# Patient Record
Sex: Male | Born: 1970 | Race: White | Hispanic: No | Smoking: Never smoker
Health system: Southern US, Community
[De-identification: ages and names within clinical notes are randomized; demographics above are authoritative.]

## PROBLEM LIST (undated history)

## (undated) DIAGNOSIS — Z87442 Personal history of urinary calculi: Secondary | ICD-10-CM

## (undated) DIAGNOSIS — T7840XA Allergy, unspecified, initial encounter: Secondary | ICD-10-CM

## (undated) DIAGNOSIS — S46011A Strain of muscle(s) and tendon(s) of the rotator cuff of right shoulder, initial encounter: Secondary | ICD-10-CM

## (undated) DIAGNOSIS — N200 Calculus of kidney: Secondary | ICD-10-CM

## (undated) DIAGNOSIS — K219 Gastro-esophageal reflux disease without esophagitis: Secondary | ICD-10-CM

## (undated) HISTORY — PX: LIPOMA EXCISION: SHX5283

## (undated) HISTORY — DX: Allergy, unspecified, initial encounter: T78.40XA

---

## 1998-09-25 ENCOUNTER — Encounter: Payer: Self-pay | Admitting: Emergency Medicine

## 1998-09-25 ENCOUNTER — Emergency Department (HOSPITAL_COMMUNITY): Admission: EM | Admit: 1998-09-25 | Discharge: 1998-09-25 | Payer: Self-pay | Admitting: Emergency Medicine

## 2000-12-14 ENCOUNTER — Emergency Department (HOSPITAL_COMMUNITY): Admission: EM | Admit: 2000-12-14 | Discharge: 2000-12-14 | Payer: Self-pay | Admitting: Emergency Medicine

## 2001-10-07 ENCOUNTER — Emergency Department (HOSPITAL_COMMUNITY): Admission: EM | Admit: 2001-10-07 | Discharge: 2001-10-07 | Payer: Self-pay

## 2001-10-31 ENCOUNTER — Emergency Department (HOSPITAL_COMMUNITY): Admission: EM | Admit: 2001-10-31 | Discharge: 2001-10-31 | Payer: Self-pay | Admitting: *Deleted

## 2001-10-31 ENCOUNTER — Encounter: Payer: Self-pay | Admitting: *Deleted

## 2002-09-02 ENCOUNTER — Encounter: Payer: Self-pay | Admitting: Emergency Medicine

## 2002-09-02 ENCOUNTER — Emergency Department (HOSPITAL_COMMUNITY): Admission: EM | Admit: 2002-09-02 | Discharge: 2002-09-02 | Payer: Self-pay | Admitting: Physical Therapy

## 2003-06-28 ENCOUNTER — Emergency Department (HOSPITAL_COMMUNITY): Admission: EM | Admit: 2003-06-28 | Discharge: 2003-06-28 | Payer: Self-pay | Admitting: Emergency Medicine

## 2004-03-03 ENCOUNTER — Ambulatory Visit: Payer: Self-pay | Admitting: Internal Medicine

## 2004-09-28 ENCOUNTER — Emergency Department: Payer: Self-pay | Admitting: Emergency Medicine

## 2004-10-01 ENCOUNTER — Emergency Department: Payer: Self-pay | Admitting: Emergency Medicine

## 2005-01-26 ENCOUNTER — Ambulatory Visit: Payer: Self-pay | Admitting: Family Medicine

## 2005-02-03 ENCOUNTER — Encounter: Payer: Self-pay | Admitting: Family Medicine

## 2005-02-16 ENCOUNTER — Encounter: Payer: Self-pay | Admitting: Family Medicine

## 2007-06-10 ENCOUNTER — Emergency Department: Payer: Self-pay | Admitting: Internal Medicine

## 2016-08-29 ENCOUNTER — Ambulatory Visit: Payer: Self-pay | Admitting: Nurse Practitioner

## 2017-10-18 DIAGNOSIS — S46011A Strain of muscle(s) and tendon(s) of the rotator cuff of right shoulder, initial encounter: Secondary | ICD-10-CM

## 2017-10-18 HISTORY — DX: Strain of muscle(s) and tendon(s) of the rotator cuff of right shoulder, initial encounter: S46.011A

## 2017-11-02 ENCOUNTER — Encounter (HOSPITAL_BASED_OUTPATIENT_CLINIC_OR_DEPARTMENT_OTHER): Payer: Self-pay | Admitting: Physician Assistant

## 2017-11-02 DIAGNOSIS — N200 Calculus of kidney: Secondary | ICD-10-CM

## 2017-11-02 NOTE — H&P (Signed)
Reginald [name] is an 47 y.o. male.   Chief Complaint: right shoulder rotator cuff tear HPI: Reginald [name] is a 47 year-old seen for follow up evaluation from his right shoulder injury that occurred while jumping on a trampoline on October 18, 2017.  He underwent an MRI that has revealed a complete tear of the subscapularis, as well as supraspinatus tendon.  He continues to have pain and weakness in the arm.  Unable to raise the arm without pain or weakness.     Past Medical History:  Diagnosis Date  . Kidney calculi   . Traumatic complete tear of right rotator cuff 10/18/2017    History reviewed. No pertinent surgical history.  Family History  Problem Relation Age of Onset  . Arthritis Mother   . Hypertension Father    Social History:  reports that he has never smoked. He has never used smokeless tobacco. He reports that he drank alcohol. He reports that he does not use drugs.  Allergies: No Known Allergies  No medications prior to admission.    No results found for this or any previous visit (from the past 48 hour(s)). No results found.  Review of Systems  Eyes: Negative.   Respiratory: Negative.   Cardiovascular: Negative.   Gastrointestinal: Negative.   Genitourinary: Negative.   Musculoskeletal: Positive for joint pain.  Neurological: Negative.   Endo/Heme/Allergies: Negative.   Psychiatric/Behavioral: Negative.     There were no vitals taken for this visit. Physical Exam  Constitutional: He is oriented to person, place, and time. He appears well-developed and well-nourished.  HENT:  Head: Normocephalic and atraumatic.  Mouth/Throat: Oropharynx is clear and moist.  Eyes: Pupils are equal, round, and reactive to light. Conjunctivae are normal.  Neck: Neck supple.  Cardiovascular: Normal rate and regular rhythm.  Respiratory: Effort normal and breath sounds normal.  GI: Soft. Bowel sounds are normal.  Genitourinary:  Genitourinary Comments: Not pertinent to current  symptomatology therefore not examined.  Musculoskeletal:  Examination of his right shoulder reveals active range of motion of 50%, passive to 100% with pain and weakness.  No instability.  Examination of his left shoulder reveals full range of motion without pain, weakness or instability.    Neurological: He is alert and oriented to person, place, and time.  Skin: Skin is warm and dry.  Psychiatric: He has a normal mood and affect. His behavior is normal.     Assessment Right shoulder acute traumatic rotator cuff tear with subscapularis tear.   Plan At this point I have discussed this with him in detail.  Would recommend with these findings that we proceed with right shoulder arthroscopy with rotator cuff repair.  Risks, complications and benefits of the surgery have been described to him in detail and he understands this completely.  We will plan on setting him up for this at some point in the near future.   Cristin Penaflor J Julieth Tugman, PA-C 11/02/2017, 10:03 AM

## 2017-11-08 ENCOUNTER — Other Ambulatory Visit: Payer: Self-pay

## 2017-11-08 ENCOUNTER — Encounter (HOSPITAL_BASED_OUTPATIENT_CLINIC_OR_DEPARTMENT_OTHER): Payer: Self-pay | Admitting: *Deleted

## 2017-11-14 ENCOUNTER — Encounter (HOSPITAL_BASED_OUTPATIENT_CLINIC_OR_DEPARTMENT_OTHER): Payer: Self-pay

## 2017-11-14 ENCOUNTER — Encounter (HOSPITAL_BASED_OUTPATIENT_CLINIC_OR_DEPARTMENT_OTHER)
Admission: RE | Disposition: A | Discharge status: Home / Self Care | Payer: Self-pay | Source: Ambulatory Visit | Attending: Orthopedic Surgery

## 2017-11-14 ENCOUNTER — Ambulatory Visit (HOSPITAL_BASED_OUTPATIENT_CLINIC_OR_DEPARTMENT_OTHER): Payer: Self-pay | Admitting: Certified Registered"

## 2017-11-14 ENCOUNTER — Other Ambulatory Visit: Payer: Self-pay

## 2017-11-14 ENCOUNTER — Ambulatory Visit (HOSPITAL_BASED_OUTPATIENT_CLINIC_OR_DEPARTMENT_OTHER)
Admission: RE | Admit: 2017-11-14 | Discharge: 2017-11-14 | Disposition: A | Discharge status: Home / Self Care | Payer: Self-pay | Source: Ambulatory Visit | Attending: Orthopedic Surgery | Admitting: Orthopedic Surgery

## 2017-11-14 DIAGNOSIS — X58XXXA Exposure to other specified factors, initial encounter: Secondary | ICD-10-CM | POA: Insufficient documentation

## 2017-11-14 DIAGNOSIS — Z791 Long term (current) use of non-steroidal anti-inflammatories (NSAID): Secondary | ICD-10-CM | POA: Insufficient documentation

## 2017-11-14 DIAGNOSIS — S46811A Strain of other muscles, fascia and tendons at shoulder and upper arm level, right arm, initial encounter: Secondary | ICD-10-CM | POA: Insufficient documentation

## 2017-11-14 DIAGNOSIS — N200 Calculus of kidney: Secondary | ICD-10-CM

## 2017-11-14 DIAGNOSIS — S43431A Superior glenoid labrum lesion of right shoulder, initial encounter: Secondary | ICD-10-CM | POA: Insufficient documentation

## 2017-11-14 DIAGNOSIS — M7541 Impingement syndrome of right shoulder: Secondary | ICD-10-CM | POA: Insufficient documentation

## 2017-11-14 DIAGNOSIS — Y9344 Activity, trampolining: Secondary | ICD-10-CM | POA: Insufficient documentation

## 2017-11-14 DIAGNOSIS — S46011A Strain of muscle(s) and tendon(s) of the rotator cuff of right shoulder, initial encounter: Secondary | ICD-10-CM | POA: Diagnosis present

## 2017-11-14 HISTORY — PX: SHOULDER ARTHROSCOPY WITH ROTATOR CUFF REPAIR AND SUBACROMIAL DECOMPRESSION: SHX5686

## 2017-11-14 HISTORY — PX: SHOULDER ARTHROSCOPY WITH BICEPS TENDON REPAIR: SHX5674

## 2017-11-14 HISTORY — DX: Strain of muscle(s) and tendon(s) of the rotator cuff of right shoulder, initial encounter: S46.011A

## 2017-11-14 HISTORY — DX: Calculus of kidney: N20.0

## 2017-11-14 HISTORY — DX: Gastro-esophageal reflux disease without esophagitis: K21.9

## 2017-11-14 SURGERY — SHOULDER ARTHROSCOPY WITH BICEPS TENDON REPAIR
Anesthesia: General | Site: Shoulder | Laterality: Right

## 2017-11-14 MED ORDER — CYCLOBENZAPRINE HCL 5 MG PO TABS: 5.0000 mg | ORAL_TABLET | Freq: Three times a day (TID) | ORAL | 0 refills | Status: DC | PRN

## 2017-11-14 MED ORDER — LIDOCAINE HCL (CARDIAC) PF 100 MG/5ML IV SOSY
PREFILLED_SYRINGE | INTRAVENOUS | Status: AC
  Filled 2017-11-14: qty 5

## 2017-11-14 MED ORDER — SUGAMMADEX SODIUM 200 MG/2ML IV SOLN
INTRAVENOUS | Status: DC | PRN
  Administered 2017-11-14: 100 mg via INTRAVENOUS

## 2017-11-14 MED ORDER — PROPOFOL 10 MG/ML IV BOLUS
INTRAVENOUS | Status: DC | PRN
Start: 2017-11-14 — End: 2017-11-14
  Administered 2017-11-14: 200 mg via INTRAVENOUS

## 2017-11-14 MED ORDER — DEXAMETHASONE SODIUM PHOSPHATE 10 MG/ML IJ SOLN
INTRAMUSCULAR | Status: AC
  Filled 2017-11-14: qty 1

## 2017-11-14 MED ORDER — LIDOCAINE 2% (20 MG/ML) 5 ML SYRINGE
INTRAMUSCULAR | Status: DC | PRN
  Administered 2017-11-14: 80 mg via INTRAVENOUS

## 2017-11-14 MED ORDER — CEFAZOLIN SODIUM-DEXTROSE 2-4 GM/100ML-% IV SOLN
INTRAVENOUS | Status: AC
  Filled 2017-11-14: qty 100

## 2017-11-14 MED ORDER — FENTANYL CITRATE (PF) 100 MCG/2ML IJ SOLN
50.0000 ug | INTRAMUSCULAR | Status: DC | PRN
  Administered 2017-11-14: 100 ug via INTRAVENOUS

## 2017-11-14 MED ORDER — SCOPOLAMINE 1 MG/3DAYS TD PT72: 1.0000 | MEDICATED_PATCH | Freq: Once | TRANSDERMAL | Status: DC | PRN

## 2017-11-14 MED ORDER — ONDANSETRON HCL 4 MG/2ML IJ SOLN
INTRAMUSCULAR | Status: AC
Start: 2017-11-14 — End: ?
  Filled 2017-11-14: qty 2

## 2017-11-14 MED ORDER — ONDANSETRON HCL 4 MG/2ML IJ SOLN
INTRAMUSCULAR | Status: DC | PRN
  Administered 2017-11-14: 4 mg via INTRAVENOUS

## 2017-11-14 MED ORDER — SUGAMMADEX SODIUM 200 MG/2ML IV SOLN
INTRAVENOUS | Status: AC
  Filled 2017-11-14: qty 2

## 2017-11-14 MED ORDER — BUPIVACAINE-EPINEPHRINE (PF) 0.25% -1:200000 IJ SOLN
INTRAMUSCULAR | Status: AC
Start: 2017-11-14 — End: ?
  Filled 2017-11-14: qty 30

## 2017-11-14 MED ORDER — SODIUM CHLORIDE 0.9 % IV SOLN
INTRAVENOUS | Status: DC | PRN
  Administered 2017-11-14: 20 ug/min via INTRAVENOUS

## 2017-11-14 MED ORDER — FENTANYL CITRATE (PF) 100 MCG/2ML IJ SOLN
INTRAMUSCULAR | Status: AC
  Filled 2017-11-14: qty 2

## 2017-11-14 MED ORDER — EPINEPHRINE 30 MG/30ML IJ SOLN
INTRAMUSCULAR | Status: AC
  Filled 2017-11-14: qty 1

## 2017-11-14 MED ORDER — PROPOFOL 10 MG/ML IV BOLUS
INTRAVENOUS | Status: AC
  Filled 2017-11-14: qty 20

## 2017-11-14 MED ORDER — ROCURONIUM BROMIDE 10 MG/ML (PF) SYRINGE
PREFILLED_SYRINGE | INTRAVENOUS | Status: AC
  Filled 2017-11-14: qty 10

## 2017-11-14 MED ORDER — POVIDONE-IODINE 7.5 % EX SOLN: Freq: Once | CUTANEOUS | Status: DC

## 2017-11-14 MED ORDER — CEFAZOLIN SODIUM-DEXTROSE 2-4 GM/100ML-% IV SOLN
2.0000 g | INTRAVENOUS | Status: AC
  Administered 2017-11-14: 2 g via INTRAVENOUS

## 2017-11-14 MED ORDER — ROCURONIUM BROMIDE 100 MG/10ML IV SOLN
INTRAVENOUS | Status: DC | PRN
  Administered 2017-11-14: 50 mg via INTRAVENOUS

## 2017-11-14 MED ORDER — OXYCODONE HCL 5 MG PO TABS: ORAL_TABLET | ORAL | 0 refills | Status: DC

## 2017-11-14 MED ORDER — SODIUM CHLORIDE 0.9 % IR SOLN
Status: DC | PRN
  Administered 2017-11-14: 15000 mL

## 2017-11-14 MED ORDER — PHENYLEPHRINE HCL 10 MG/ML IJ SOLN
INTRAMUSCULAR | Status: AC
  Filled 2017-11-14: qty 1

## 2017-11-14 MED ORDER — LACTATED RINGERS IV SOLN: INTRAVENOUS | Status: DC

## 2017-11-14 MED ORDER — MIDAZOLAM HCL 2 MG/2ML IJ SOLN
INTRAMUSCULAR | Status: AC
  Filled 2017-11-14: qty 2

## 2017-11-14 MED ORDER — DEXAMETHASONE SODIUM PHOSPHATE 10 MG/ML IJ SOLN
INTRAMUSCULAR | Status: DC | PRN
  Administered 2017-11-14: 10 mg via INTRAVENOUS

## 2017-11-14 MED ORDER — LACTATED RINGERS IV SOLN
INTRAVENOUS | Status: DC
  Administered 2017-11-14 (×2): via INTRAVENOUS

## 2017-11-14 MED ORDER — CHLORHEXIDINE GLUCONATE 4 % EX LIQD: 60.0000 mL | Freq: Once | CUTANEOUS | Status: DC

## 2017-11-14 MED ORDER — MIDAZOLAM HCL 2 MG/2ML IJ SOLN
1.0000 mg | INTRAMUSCULAR | Status: DC | PRN
  Administered 2017-11-14: 2 mg via INTRAVENOUS

## 2017-11-14 SURGICAL SUPPLY — 88 items
ANCHOR SUT BIO SW 4.75X19.1 (Anchor) ×9 IMPLANT
BENZOIN TINCTURE PRP APPL 2/3 (GAUZE/BANDAGES/DRESSINGS) IMPLANT
BLADE CUDA 5.5 (BLADE) IMPLANT
BLADE CUTTER GATOR 3.5 (BLADE) ×3 IMPLANT
BLADE GREAT WHITE 4.2 (BLADE) IMPLANT
BLADE GREAT WHITE 4.2MM (BLADE)
BLADE SURG 15 STRL LF DISP TIS (BLADE) IMPLANT
BLADE SURG 15 STRL SS (BLADE)
BNDG COHESIVE 4X5 TAN STRL (GAUZE/BANDAGES/DRESSINGS) IMPLANT
BUR OVAL 6.0 (BURR) ×3 IMPLANT
BURR OVAL 8 FLU 5.0MM X 13CM (MISCELLANEOUS) ×1
BURR OVAL 8 FLU 5.0X13 (MISCELLANEOUS) ×2 IMPLANT
CANNULA TWIST IN 8.25X7CM (CANNULA) ×6 IMPLANT
CLOSURE WOUND 1/2 X4 (GAUZE/BANDAGES/DRESSINGS)
DECANTER SPIKE VIAL GLASS SM (MISCELLANEOUS) IMPLANT
DISSECTOR  3.8MM X 13CM (MISCELLANEOUS) ×2
DISSECTOR 3.8MM X 13CM (MISCELLANEOUS) ×1 IMPLANT
DRAPE SHOULDER BEACH CHAIR (DRAPES) ×3 IMPLANT
DRAPE U-SHAPE 47X51 STRL (DRAPES) ×6 IMPLANT
DRSG PAD ABDOMINAL 8X10 ST (GAUZE/BANDAGES/DRESSINGS) ×3 IMPLANT
DURAPREP 26ML APPLICATOR (WOUND CARE) ×3 IMPLANT
ELECT REM PT RETURN 9FT ADLT (ELECTROSURGICAL) ×3
ELECTRODE REM PT RTRN 9FT ADLT (ELECTROSURGICAL) ×1 IMPLANT
FIBERTAPE 2 W/STRL NDL 17 (SUTURE) ×6 IMPLANT
GAUZE SPONGE 4X4 12PLY STRL (GAUZE/BANDAGES/DRESSINGS) ×3 IMPLANT
GAUZE XEROFORM 1X8 LF (GAUZE/BANDAGES/DRESSINGS) ×3 IMPLANT
GLOVE BIO SURGEON STRL SZ7 (GLOVE) IMPLANT
GLOVE BIOGEL PI IND STRL 7.0 (GLOVE) ×2 IMPLANT
GLOVE BIOGEL PI IND STRL 7.5 (GLOVE) ×1 IMPLANT
GLOVE BIOGEL PI INDICATOR 7.0 (GLOVE) ×4
GLOVE BIOGEL PI INDICATOR 7.5 (GLOVE) ×2
GLOVE ECLIPSE 6.5 STRL STRAW (GLOVE) ×3 IMPLANT
GLOVE SS BIOGEL STRL SZ 7.5 (GLOVE) ×1 IMPLANT
GLOVE SUPERSENSE BIOGEL SZ 7.5 (GLOVE) ×2
GOWN STRL REUS W/ TWL LRG LVL3 (GOWN DISPOSABLE) ×3 IMPLANT
GOWN STRL REUS W/ TWL XL LVL3 (GOWN DISPOSABLE) ×1 IMPLANT
GOWN STRL REUS W/TWL LRG LVL3 (GOWN DISPOSABLE) ×6
GOWN STRL REUS W/TWL XL LVL3 (GOWN DISPOSABLE) ×2
LOOP 2 FIBERLINK CLOSED (SUTURE) IMPLANT
MANIFOLD NEPTUNE II (INSTRUMENTS) ×3 IMPLANT
NDL SAFETY ECLIPSE 18X1.5 (NEEDLE) ×1 IMPLANT
NDL SUT 6 .5 CRC .975X.05 MAYO (NEEDLE) IMPLANT
NEEDLE 1/2 CIR CATGUT .05X1.09 (NEEDLE) IMPLANT
NEEDLE HYPO 18GX1.5 SHARP (NEEDLE) ×2
NEEDLE MAYO TAPER (NEEDLE)
NEEDLE SCORPION MULTI FIRE (NEEDLE) ×3 IMPLANT
PACK ARTHROSCOPY DSU (CUSTOM PROCEDURE TRAY) ×3 IMPLANT
PACK BASIN DAY SURGERY FS (CUSTOM PROCEDURE TRAY) ×3 IMPLANT
PAD ALCOHOL SWAB (MISCELLANEOUS) ×6 IMPLANT
PAD ORTHO SHOULDER 7X19 LRG (SOFTGOODS) ×3 IMPLANT
PENCIL BUTTON HOLSTER BLD 10FT (ELECTRODE) IMPLANT
PORT APPOLLO RF 90DEGREE MULTI (SURGICAL WAND) ×3 IMPLANT
RESTRAINT HEAD UNIVERSAL NS (MISCELLANEOUS) ×3 IMPLANT
SHEET MEDIUM DRAPE 40X70 STRL (DRAPES) IMPLANT
SLEEVE SCD COMPRESS KNEE MED (MISCELLANEOUS) ×3 IMPLANT
SLING ARM FOAM STRAP LRG (SOFTGOODS) IMPLANT
SLING ARM IMMOBILIZER MED (SOFTGOODS) IMPLANT
SLING ARM MED ADULT FOAM STRAP (SOFTGOODS) IMPLANT
SLING ARM XL FOAM STRAP (SOFTGOODS) ×3 IMPLANT
SLING ULTRA III MED (ORTHOPEDIC SUPPLIES) IMPLANT
SPONGE LAP 4X18 RFD (DISPOSABLE) IMPLANT
STRIP CLOSURE SKIN 1/2X4 (GAUZE/BANDAGES/DRESSINGS) IMPLANT
SUCTION FRAZIER HANDLE 10FR (MISCELLANEOUS)
SUCTION TUBE FRAZIER 10FR DISP (MISCELLANEOUS) IMPLANT
SUT ETHILON 3 0 PS 1 (SUTURE) ×3 IMPLANT
SUT FIBERWIRE #2 38 T-5 BLUE (SUTURE)
SUT PDS AB 2-0 CT2 27 (SUTURE) IMPLANT
SUT PROLENE 3 0 PS 2 (SUTURE) IMPLANT
SUT TIGER TAPE 7 IN WHITE (SUTURE) IMPLANT
SUT VIC AB 0 SH 27 (SUTURE) IMPLANT
SUT VIC AB 2-0 PS2 27 (SUTURE) IMPLANT
SUT VIC AB 2-0 SH 27 (SUTURE)
SUT VIC AB 2-0 SH 27XBRD (SUTURE) IMPLANT
SUTURE FIBERWR #2 38 T-5 BLUE (SUTURE) IMPLANT
SUTURE TAPE TIGERLINK 1.3MM BL (SUTURE) ×1 IMPLANT
SUTURETAPE TIGERLINK 1.3MM BL (SUTURE) ×3
SYR 5ML LL (SYRINGE) ×3 IMPLANT
SYR BULB 3OZ (MISCELLANEOUS) IMPLANT
TAPE FIBER 2MM 7IN #2 BLUE (SUTURE) ×3 IMPLANT
TAPE HYPAFIX 6 X30' (GAUZE/BANDAGES/DRESSINGS)
TAPE HYPAFIX 6X30 (GAUZE/BANDAGES/DRESSINGS) IMPLANT
TAPE STRIPS DRAPE STRL (GAUZE/BANDAGES/DRESSINGS) ×3 IMPLANT
TOWEL GREEN STERILE FF (TOWEL DISPOSABLE) ×3 IMPLANT
TUBE CONNECTING 20'X1/4 (TUBING)
TUBE CONNECTING 20X1/4 (TUBING) IMPLANT
TUBING ARTHRO INFLOW-ONLY STRL (TUBING) ×3 IMPLANT
WAND STAR VAC 90 (SURGICAL WAND) IMPLANT
WATER STERILE IRR 1000ML POUR (IV SOLUTION) ×3 IMPLANT

## 2017-11-14 NOTE — Interval H&P Note (Signed)
History and Physical Interval Note:  11/14/2017 11:59 AM  Reginald [name]  has presented today for surgery, with the diagnosis of RIGHT SHOULDER IMPINGEMENT SYNDROME, STRAIN OF MUSCLE AND TENDON OF ROTATOR CUFF  M24.111  The various methods of treatment have been discussed with the patient and family. After consideration of risks, benefits and other options for treatment, the patient has consented to  Procedure(s): RIGHT SHOULDER ARTHROSCOPY DEBRIDEMENT, ACROMIOPLASTY, ROTATOR CUFF REPAIR (Right) as a surgical intervention .  The patient's history has been reviewed, patient examined, no change in status, stable for surgery.  I have reviewed the patient's chart and labs.  Questions were answered to the patient's satisfaction.     Lorn Junes

## 2017-11-14 NOTE — Discharge Instructions (Signed)
°  ° °  Post Anesthesia Home Care Instructions ° °Activity: °Get plenty of rest for the remainder of the day. A responsible individual must stay with you for 24 hours following the procedure.  °For the next 24 hours, DO NOT: °-Drive a car °-Operate machinery °-Drink alcoholic beverages °-Take any medication unless instructed by your physician °-Make any legal decisions or sign important papers. ° °Meals: °Start with liquid foods such as gelatin or soup. Progress to regular foods as tolerated. Avoid greasy, spicy, heavy foods. If nausea and/or vomiting occur, drink only clear liquids until the nausea and/or vomiting subsides. Call your physician if vomiting continues. ° °Special Instructions/Symptoms: °Your throat [place] feel dry or sore from the anesthesia or the breathing tube placed in your throat during surgery. If this causes discomfort, gargle with warm salt water. The discomfort should disappear within 24 hours. ° °If you had a scopolamine patch placed behind your ear for the management of post- operative nausea and/or vomiting: ° °1. The medication in the patch is effective for 72 hours, after which it should be removed.  Wrap patch in a tissue and discard in the trash. Wash hands thoroughly with soap and water. °2. You Deisher remove the patch earlier than 72 hours if you experience unpleasant side effects which [place] include dry mouth, dizziness or visual disturbances. °3. Avoid touching the patch. Wash your hands with soap and water after contact with the patch. °  ° ° °Regional Anesthesia Blocks ° °1. Numbness or the inability to move the "blocked" extremity Peery last from 3-48 hours after placement. The length of time depends on the medication injected and your individual response to the medication. If the numbness is not going away after 48 hours, call your surgeon. ° °2. The extremity that is blocked will need to be protected until the numbness is gone and the  Strength has returned. Because you cannot feel it,  you will need to take extra care to avoid injury. Because it [place] be weak, you [place] have difficulty moving it or using it. You Day not know what position it is in without looking at it while the block is in effect. ° °3. For blocks in the legs and feet, returning to weight bearing and walking needs to be done carefully. You will need to wait until the numbness is entirely gone and the strength has returned. You should be able to move your leg and foot normally before you try and bear weight or walk. You will need someone to be with you when you first try to ensure you do not fall and possibly risk injury. ° °4. Bruising and tenderness at the needle site are common side effects and will resolve in a few days. ° °5. Persistent numbness or new problems with movement should be communicated to the surgeon or the [facility] ([phone])/ [facility] ([phone]). °

## 2017-11-14 NOTE — Anesthesia Procedure Notes (Signed)
Anesthesia Regional Block: Interscalene brachial plexus block   Pre-Anesthetic Checklist: ,, timeout performed, Correct Patient, Correct Site, Correct Laterality, Correct Procedure, Correct Position, site marked, Risks and benefits discussed,  Surgical consent,  Pre-op evaluation,  At surgeon's request and post-op pain management  Laterality: Right  Prep: chloraprep       Needles:  Injection technique: Single-shot  Needle Type: Stimulator Needle - 40      Needle Gauge: 22     Additional Needles:   Procedures:, nerve stimulator,,,,,,,  Narrative:  Start time: 11/14/2017 11:30 AM End time: 11/14/2017 11:40 AM Injection made incrementally with aspirations every 5 mL.  Performed by: Personally   Additional Notes: 20 cc o.75% Ropivacaine injected easily

## 2017-11-14 NOTE — Anesthesia Preprocedure Evaluation (Signed)
Anesthesia Evaluation  Patient identified by MRN, date of birth, ID band Patient awake    Reviewed: Allergy & Precautions, NPO status , Patient's Chart, lab work & pertinent test results  Airway Mallampati: II  TM Distance: >3 FB Neck ROM: Full    Dental  (+) Teeth Intact, Dental Advisory Given   Pulmonary    breath sounds clear to auscultation       Cardiovascular  Rhythm:Regular Rate:Normal     Neuro/Psych    GI/Hepatic   Endo/Other    Renal/GU      Musculoskeletal   Abdominal   Peds  Hematology   Anesthesia Other Findings   Reproductive/Obstetrics                             Anesthesia Physical Anesthesia Plan  ASA: I  Anesthesia Plan: General and Regional   Post-op Pain Management:    Induction: Intravenous  PONV Risk Score and Plan: Ondansetron and Dexamethasone  Airway Management Planned: Oral ETT  Additional Equipment:   Intra-op Plan:   Post-operative Plan: Extubation in OR  Informed Consent: I have reviewed the patients History and Physical, chart, labs and discussed the procedure including the risks, benefits and alternatives for the proposed anesthesia with the patient or authorized representative who has indicated his/her understanding and acceptance.   Dental advisory given  Plan Discussed with: CRNA and Anesthesiologist  Anesthesia Plan Comments:         Anesthesia Quick Evaluation

## 2017-11-14 NOTE — Transfer of Care (Signed)
Immediate Anesthesia Transfer of Care Note  Patient: Reginald [name]  Procedure(s) Performed: SHOULDER ARTHROSCOPY WITH BICEPS TENDON REPAIR (Right Shoulder) SHOULDER ARTHROSCOPY WITH ROTATOR CUFF REPAIR AND SUBACROMIAL DECOMPRESSION AND LABRAL DEBRIDMENT (Right Shoulder)  Patient Location: PACU  Anesthesia Type:General  Level of Consciousness: drowsy  Airway & Oxygen Therapy: Patient Spontanous Breathing and Patient connected to face mask oxygen  Post-op Assessment: Report given to RN and Post -op Vital signs reviewed and stable  Post vital signs: Reviewed and stable  Last Vitals:  Vitals Value Taken Time  BP 115/72 11/14/2017  2:20 PM  Temp    Pulse 73 11/14/2017  2:23 PM  Resp 20 11/14/2017  2:23 PM  SpO2 97 % 11/14/2017  2:23 PM  Vitals shown include unvalidated device data.  Last Pain:  Vitals:   11/14/17 1100  TempSrc: Oral  PainSc:       Patients Stated Pain Goal: 0 (16/10/96 0454)  Complications: No apparent anesthesia complications

## 2017-11-14 NOTE — Anesthesia Postprocedure Evaluation (Signed)
Anesthesia Post Note  Patient: Reginald [name]  Procedure(s) Performed: SHOULDER ARTHROSCOPY WITH BICEPS TENDON REPAIR (Right Shoulder) SHOULDER ARTHROSCOPY WITH ROTATOR CUFF REPAIR AND SUBACROMIAL DECOMPRESSION AND LABRAL DEBRIDMENT (Right Shoulder)     Patient location during evaluation: PACU Anesthesia Type: General and Regional Level of consciousness: awake and alert Pain management: pain level controlled Vital Signs Assessment: post-procedure vital signs reviewed and stable Respiratory status: spontaneous breathing, nonlabored ventilation, respiratory function stable and patient connected to nasal cannula oxygen Cardiovascular status: blood pressure returned to baseline and stable Postop Assessment: no apparent nausea or vomiting Anesthetic complications: no    Last Vitals:  Vitals:   11/14/17 1515 11/14/17 1545  BP: 122/88 (!) 132/91  Pulse: 86 82  Resp: 20 18  Temp:  36.4 C  SpO2: 98% 95%    Last Pain:  Vitals:   11/14/17 1545  TempSrc:   PainSc: 0-No pain                 Veverly Larimer S

## 2017-11-14 NOTE — Anesthesia Procedure Notes (Signed)
Procedure Name: Intubation Date/Time: 11/14/2017 12:18 PM Performed by: Gwyndolyn Saxon, CRNA Pre-anesthesia Checklist: Patient identified, Emergency Drugs available, Suction available, Patient being monitored and Timeout performed Patient Re-evaluated:Patient Re-evaluated prior to induction Oxygen Delivery Method: Circle system utilized Preoxygenation: Pre-oxygenation with 100% oxygen Induction Type: IV induction Ventilation: Mask ventilation without difficulty Laryngoscope Size: Miller and 2 Grade View: Grade I Tube type: Oral Tube size: 7.0 mm Number of attempts: 1 Placement Confirmation: ETT inserted through vocal cords under direct vision,  positive ETCO2,  CO2 detector and breath sounds checked- equal and bilateral Secured at: 24 cm Tube secured with: Tape Dental Injury: Teeth and Oropharynx as per pre-operative assessment

## 2017-11-14 NOTE — Progress Notes (Signed)
Assisted Dr. Joslin with right, ultrasound guided, interscalene  block. Side rails up, monitors on throughout procedure. See vital signs in flow sheet. Tolerated Procedure well. 

## 2017-11-15 NOTE — Op Note (Signed)
NAME: Reginald [name], Reginald [name] MEDICAL RECORD UX:[id number] ACCOUNT [id number] DATE OF BIRTH:November 17, 1970 FACILITY: [facility] LOCATION: [facility] PHYSICIAN:Verlan Grotz Venetia Maxon, MD  OPERATIVE REPORT  DATE OF PROCEDURE:  11/14/2017  PREOPERATIVE DIAGNOSIS: 1.  Right shoulder acute traumatic rotator cuff tear with supraspinatus tear and subscapularis tear. 2.  Right shoulder acute traumatic superior labrum tear with biceps tendon tear. 3.  Right shoulder chronic nontraumatic impingement.  POSTOPERATIVE DIAGNOSES: 1.  Right shoulder acute traumatic rotator cuff tear with supraspinatus tear and subscapularis tear. 2.  Right shoulder acute traumatic superior labrum tear with biceps tendon tear. 3.  Right shoulder chronic nontraumatic impingement.  PROCEDURE PERFORMED: 1.  Right shoulder exam under anesthesia followed by arthroscopically assisted complex rotator cuff repair using Arthrex SwiveLock anchors with supraspinatus repair x2 and Arthrex SwiveLock anchor with subscapularis repair x1. 2.  Right shoulder biceps tenodesis with Arthrex SwiveLock anchor x1. 3.  Right shoulder labrum tear debridement. 4.  Right shoulder subacromial decompression.  SURGEON:  Elsie Saas, MD  ASSISTANT:  Luna Glasgow, PA.  ANESTHESIA:  General.  OPERATIVE TIME:  1 hour and 15 minutes.  SPECIMENS:  None.  INDICATIONS:  The patient is a 47 year old gentleman who sustained an injury to his right shoulder approximately a month ago.  Exam and MRI revealed a complete rotator cuff tear, biceps tendon and labrum tear with impingement.  He has failed conservative  care and is now to undergo arthroscopy and repair.  DESCRIPTION OF PROCEDURE:  The patient was brought to the operating room on 11/14/2017 after an interscalene block was placed in the holding room by anesthesia.  He was placed on the operating table in supine position.  He received antibiotics  preoperatively for prophylaxis.  After being placed under general  anesthesia, his right shoulder was examined.  He had full range of motion in the shoulder with stable ligamentous exam.  He was then placed in beach chair position and his shoulder and arm  was prepped in usual sterile DuraPrep and draped using sterile technique.  Timeout procedure was called and the correct right shoulder identified.  Initially, a posterior arthroscopic portal and the arthroscope with pump attached was placed in through  an anterior portal.  An arthroscopic probe was placed.  On initial inspection, the articular cartilage in the glenohumeral joint was intact.  He had partial tearing of the anterior, superior and posterior labrum 25%, which was debrided.  The anterior  inferior labrum and anterior inferior glenohumeral ligament complex was intact.  The biceps tendon anchor was torn and loose with the biceps being unstable and thus it was felt that a biceps tenodesis would be indicated.  The rotator cuff was inspected.   He had a complete tear of the subscapularis tendon as well as the supraspinatus tendon.  The rest of the rotator cuff was intact.  The inferior capsular recess was free of pathology.  Subacromial space was entered and a lateral arthroscopic portal was  made.  Large amount of bursitis was resected.  Impingement was noted and a subacromial decompression was carried out removing 6-8 mm of the undersurface of the anterolateral and anteromedial acromion and CA ligament release carried out as well.  [facility]  joint was not pathologic and thus was not resected.  At this point, the rotator cuff tear of the supraspinatus was thoroughly prepped and debrided for repair as well as a biceps tenodesis.  Through 2 accessory lateral portals, a suture loop was placed in  the biceps tendon and then the biceps tendon  was cut and then the superior labrum was debrided back to a healthy rim and base.  At this point, then the medial row anchor for the supraspinatus repair was placed and prior to  passing all the sutures,  attention was turned to the subscapularis repair as well as the biceps tenodesis.  A locking mattress suture was placed in the subscapularis tendon and then these sutures along with the biceps tenodesis suture were placed through a SwiveLock anchor,  which was then deployed into the lesser tuberosity in the anatomic position.  After this was done, then attention was turned back to the supraspinatus repair and the sutures were passed through the supraspinatus tear with a mattress suture technique with  the medial row being tied down and then all of the sutures placed in a lateral row anchor in the lateral aspect of the greater tuberosity with firm and tight repair.  This completely repaired the supraspinatus tendon tear as well.  At this point, it was  felt that all pathology had been satisfactorily addressed.  The instruments were removed.  Portals closed with 3-0 nylon suture.  Sterile dressings and a sling applied.  The patient was awakened and taken to recovery room in stable condition.    FOLLOWUP CARE:  The patient will be followed as an outpatient on oxycodone and Flexeril and abduction sling.  He will be seen back in the office in a week for sutures out and followup.  TN/NUANCE  D:11/15/2017 T:11/15/2017 JOB:[id number]

## 2017-11-16 ENCOUNTER — Encounter (HOSPITAL_BASED_OUTPATIENT_CLINIC_OR_DEPARTMENT_OTHER): Payer: Self-pay | Admitting: Orthopedic Surgery

## 2018-06-18 ENCOUNTER — Ambulatory Visit: Payer: Self-pay | Admitting: Physician Assistant

## 2018-06-18 VITALS — BP 138/100 | HR 100 | Temp 98.5°F | Resp 20 | Wt 181.0 lb

## 2018-06-18 DIAGNOSIS — K219 Gastro-esophageal reflux disease without esophagitis: Secondary | ICD-10-CM | POA: Insufficient documentation

## 2018-06-18 DIAGNOSIS — G510 Bell's palsy: Secondary | ICD-10-CM | POA: Insufficient documentation

## 2018-06-18 DIAGNOSIS — J40 Bronchitis, not specified as acute or chronic: Secondary | ICD-10-CM

## 2018-06-18 DIAGNOSIS — J309 Allergic rhinitis, unspecified: Secondary | ICD-10-CM | POA: Insufficient documentation

## 2018-06-18 DIAGNOSIS — J069 Acute upper respiratory infection, unspecified: Secondary | ICD-10-CM

## 2018-06-18 MED ORDER — PREDNISONE 20 MG PO TABS: 40.0000 mg | ORAL_TABLET | Freq: Every day | ORAL | 0 refills | Status: AC

## 2018-06-18 MED ORDER — ALBUTEROL SULFATE HFA 108 (90 BASE) MCG/ACT IN AERS
2.0000 | INHALATION_SPRAY | RESPIRATORY_TRACT | 0 refills | Status: DC | PRN
Start: 2018-06-18 — End: 2020-08-19

## 2018-06-18 MED ORDER — LIDOCAINE VISCOUS HCL 2 % MT SOLN: 10.0000 mL | OROMUCOSAL | 0 refills | Status: DC | PRN

## 2018-06-18 MED ORDER — PSEUDOEPH-BROMPHEN-DM 30-2-10 MG/5ML PO SYRP: 5.0000 mL | ORAL_SOLUTION | Freq: Four times a day (QID) | ORAL | 0 refills | Status: DC | PRN

## 2018-06-18 NOTE — Patient Instructions (Addendum)
Thank you for choosing [facility] for your health care needs.  You have been diagnosed with an upper respiratory infection (a cold) / bronchitis (a chest cold).  Recommend increase fluids; water, Gatorade, hot tea with lemon/honey, or diluted juice (1/2 juice and 1/2 water). Rest. Take over the counter Tylenol or ibuprofen for pain/fever.  Take medication as prescribed: Meds ordered this encounter  Medications  . predniSONE (DELTASONE) 20 MG tablet    Sig: Take 2 tablets (40 mg total) by mouth daily with breakfast for 5 days.    Dispense:  10 tablet    Refill:  0    Order Specific Question:   Supervising Provider    Answer:   Sabra Heck, BRIAN [[id number]]  . albuterol (PROVENTIL HFA;VENTOLIN HFA) 108 (90 Base) MCG/ACT inhaler    Sig: Inhale 2 puffs into the lungs every 4 (four) hours as needed.    Dispense:  1 Inhaler    Refill:  0    Order Specific Question:   Supervising Provider    Answer:   MILLER, BRIAN [[id number]]  . lidocaine (XYLOCAINE) 2 % solution    Sig: Use as directed 10 mLs in the mouth or throat as needed for mouth pain.    Dispense:  100 mL    Refill:  0    Order Specific Question:   Supervising Provider    Answer:   MILLER, BRIAN [[id number]]  . brompheniramine-pseudoephedrine-DM 30-2-10 MG/5ML syrup    Sig: Take 5 mLs by mouth 4 (four) times daily as needed.    Dispense:  120 mL    Refill:  0    Order Specific Question:   Supervising Provider    Answer:   Noemi Chapel [[id number]]   Goll use cool mist humidifier at home, for cough. Pal[place] wish to sleep propped up on several pillows, can help lessen cough.  Follow-up with family physician or urgent care in 4-5 days if symptoms not improving. Sooner with any worsening symptoms.  Hope you feel better soon!  Upper Respiratory Infection, Adult An upper respiratory infection (URI) affects the nose, throat, and upper air passages. URIs are caused by germs (viruses). The most common type of URI is often called "the common cold." Medicines  cannot cure URIs, but you can do things at home to relieve your symptoms. URIs usually get better within 7-10 days. Follow these instructions at home: Activity  Rest as needed.  If you have a fever, stay home from work or school until your fever is gone, or until your doctor says you [place] return to work or school. ? You should stay home until you cannot spread the infection anymore (you are not contagious). ? Your doctor Fournet have you wear a face mask so you have less risk of spreading the infection. Relieving symptoms  Gargle with a salt-water mixture 3-4 times a day or as needed. To make a salt-water mixture, completely dissolve -1 tsp of salt in 1 cup of warm water.  Use a cool-mist humidifier to add moisture to the air. This can help you breathe more easily. Eating and drinking   Drink enough fluid to keep your pee (urine) pale yellow.  Eat soups and other clear broths. General instructions   Take over-the-counter and prescription medicines only as told by your doctor. These include cold medicines, fever reducers, and cough suppressants.  Do not use any products that contain nicotine or tobacco. These include cigarettes and e-cigarettes. If you need help quitting, ask your doctor.  Avoid being where  people are smoking (avoid secondhand smoke).  Make sure you get regular shots and get the flu shot every year.  Keep all follow-up visits as told by your doctor. This is important. How to avoid spreading infection to others   Wash your hands often with soap and water. If you do not have soap and water, use hand sanitizer.  Avoid touching your mouth, face, eyes, or nose.  Cough or sneeze into a tissue or your sleeve or elbow. Do not cough or sneeze into your hand or into the air. Contact a doctor if:  You are getting worse, not better.  You have any of these: ? A fever. ? Chills. ? Brown or red mucus in your nose. ? Yellow or brown fluid (discharge)coming from your  nose. ? Pain in your face, especially when you bend forward. ? Swollen neck glands. ? Pain with swallowing. ? White areas in the back of your throat. Get help right away if:  You have shortness of breath that gets worse.  You have very bad or constant: ? Headache. ? Ear pain. ? Pain in your forehead, behind your eyes, and over your cheekbones (sinus pain). ? Chest pain.  You have long-lasting (chronic) lung disease along with any of these: ? Wheezing. ? Long-lasting cough. ? Coughing up blood. ? A change in your usual mucus.  You have a stiff neck.  You have changes in your: ? Vision. ? Hearing. ? Thinking. ? Mood. Summary  An upper respiratory infection (URI) is caused by a germ called a virus. The most common type of URI is often called "the common cold."  URIs usually get better within 7-10 days.  Take over-the-counter and prescription medicines only as told by your doctor. This information is not intended to replace advice given to you by your health care provider. Make sure you discuss any questions you have with your health care provider. Document Released: 09/21/2007 Document Revised: 11/25/2016 Document Reviewed: 11/25/2016 Elsevier Interactive Patient Education  2019 [employer].

## 2018-06-18 NOTE — Progress Notes (Signed)
Patient ID: Reginald [name] DOB: 30-Nov-1970 AGE: 48 y.o. MRN: [id number]   PCP: Patient, No Pcp Per   Chief Complaint:  Chief Complaint  Patient presents with  . Sore Throat    x 3d  . Chills    x3d  . chest congestion    x 3d     Subjective:    HPI:  Reginald [name] is a 48 y.o. male presents for evaluation  Chief Complaint  Patient presents with  . Sore Throat    x 3d  . Chills    x3d  . chest congestion    x 55d    48 year old male presents to [facility] with four day history of URI symptoms. Began with cough. Initially dry. Over past few days has become junky/productive. Associated chest congestion. Associated crackling sound in chest when lying flat. Associated sore throat, nasal congestion, rhinorrhea, postnasal drip, headache, and body aches. Patient has taken OTC Nyquil and Mucinex with minimal relief. Patient uses antihistamine daily; has cat allergy, has 9 cats at home. Denies fever (took temperature on Friday (first day of symptoms) and Saturday (2nd day of symptoms) - normal), chills, sweats, dizziness/lightheadedness, ear pain, sinus pain, chest pain, SOB, wheezing, nausea/vomiting, diarrhea.  Patient with coworker who is currently recovering from similar symptoms. Coworker was never seen by medical professional. Reginald [name] if influenza.  Patient did not receive this season's influenza vaccination. Patient with no smoking history. Denies asthma diagnosis.  Patient's primary concern, is his best friend and co-worker has stage 4 cancer. Has been given 3-6 months to live. Patient is concerned about his contagiousness.  A limited review of symptoms was performed, pertinent positives and negatives as mentioned in HPI.  The following portions of the patient's history were reviewed and updated as appropriate: allergies, current medications and past medical history.  Patient Active Problem List   Diagnosis Date Noted  . Acid reflux 06/18/2018  . Allergic rhinitis  06/18/2018  . Bell palsy 06/18/2018  . Kidney calculi   . Traumatic complete tear of right rotator cuff 10/18/2017    No Known Allergies  Current Outpatient Medications on File Prior to Visit  Medication Sig Dispense Refill  . lansoprazole (PREVACID) 30 MG capsule Take 30 mg by mouth daily at 12 noon.     No current facility-administered medications on file prior to visit.        Objective:   Vitals:   06/18/18 1011  BP: (!) 138/100  Pulse: 100  Resp: 20  Temp: 98.5 F (36.9 C)  SpO2: 96%     Wt Readings from Last 3 Encounters:  06/18/18 181 lb (82.1 kg)  11/14/17 183 lb (83 kg)    Physical Exam:   General Appearance:  Patient sitting comfortably on examination table. Conversational. Reginald [name] self-historian. In no acute distress. Afebrile.   Head:  Normocephalic, without obvious abnormality, atraumatic  Eyes:  PERRL, conjunctiva/corneas clear, EOM's intact  Ears:  Left ear canal WNL. No erythema or edema. No open wound. No visible purulent drainage. No tenderness with palpation over left tragus or with manipulation of left auricle. No visible erythema or edema of left mastoid. No tenderness with palpation over left mastoid. Right ear canal WNL. No erythema or edema. No open wound. No visible purulent drainage. No tenderness with palpation over right tragus or with manipulation of right auricle. No visible erythema or edema of right mastoid. No tenderness with palpation over right mastoid. Left TM WNL. Good light reflex. Visible landmarks.  No erythema. No injection. No bulging or retraction. No visible perforation. No serous effusion. No visible purulent effusion. No tympanostomy tube. No scar tissue. Right TM WNL. Good light reflex. Visible landmarks. No erythema. No injection. No bulging or retraction. No visible perforation. No serous effusion. No visible purulent effusion. No tympanostomy tube. No scar tissue.  Nose: Nares normal. Septum midline. No visible polyps. Nasal  mucosa reveals bilateral edema/bogginess with scant clear rhinorrhea. No sinus tenderness with percussion/palpation.  Throat: Lips, mucosa, and tongue normal; teeth and gums normal. Throat reveals mild diffuse erythema. Mild postnasal drip. No visible cobblestoning. Tonsils with no enlargement. Moderate erythema. No exduate. Uvula midline with no edema; however moderate erythema. No hot potato/muffled voice. No stridor. No trismus. No drooling.  Neck: Supple, symmetrical, trachea midline, no lymphadenopathy  Lungs:   Clear to auscultation bilaterally, respirations unlabored. Good aeration. No rales, rhonchi, crackles or wheezing. No wheezing with forced expiration. Coarse cough elicited with deep inspiration.  Heart:  Regular rate and rhythm, S1 and S2 normal, no murmur, rub, or gallop  Extremities: Extremities normal, atraumatic, no cyanosis or edema  Pulses: 2+ and symmetric  Skin: Skin color, texture, turgor normal, no rashes or lesions  Lymph nodes: Cervical, supraclavicular, and axillary nodes normal  Neurologic: Normal    Assessment & Plan:    Exam findings, diagnosis etiology and medication use and indications reviewed with patient. Follow-Up and discharge instructions provided. No emergent/urgent issues found on exam.  Patient education was provided.   Patient verbalized understanding of information provided and agrees with plan of care (POC), all questions answered. The patient is advised to call or return to clinic if condition does not see an improvement in symptoms, or to seek the care of the closest emergency department if condition worsens with the below plan.    1. Upper respiratory tract infection, unspecified type - lidocaine (XYLOCAINE) 2 % solution; Use as directed 10 mLs in the mouth or throat as needed for mouth pain.  Dispense: 100 mL; Refill: 0 - brompheniramine-pseudoephedrine-DM 30-2-10 MG/5ML syrup; Take 5 mLs by mouth 4 (four) times daily as needed.  Dispense: 120  mL; Refill: 0  2. Bronchitis - predniSONE (DELTASONE) 20 MG tablet; Take 2 tablets (40 mg total) by mouth daily with breakfast for 5 days.  Dispense: 10 tablet; Refill: 0 - albuterol (PROVENTIL HFA;VENTOLIN HFA) 108 (90 Base) MCG/ACT inhaler; Inhale 2 puffs into the lungs every 4 (four) hours as needed.  Dispense: 1 Inhaler; Refill: 0  Patient with four day history of URI symptoms; cough, sore throat, nasal congestion, PND. VSS, afebrile, in no acute distress, clear lung sounds. History of allergic rhinitis. No smoking or asthma history. Suspect patient has self-limited viral URI with associated bronchitis. Prescribed prednisone 40mg  qd x 5 days and albuterol inhaler. Prescribed viscous lidocaine for sore throat and Bromphed cough syrup for cough relief. Advised increase fluids and rest. Do not suspect influenza and patient out of antiviral prescribing window from symptom onset; discussed with patient, patient declined flu test. Discussed patient's contagiousness, even if adenovirus or virus other than flu, advised distancing self from his close friend with advanced stage cancer, until he is feeling well. Advised patient follow-up with PCP or urgent care in 4-5 days if symptoms not improving, sooner with any worsening symptoms.   Darlin Priestly, MHS, PA-C Montey Hora, MHS, PA-C Advanced Practice Provider [facility]  [address] [facility], [place], [place] [address] (p):  [phone] [email or web address]

## 2018-06-20 ENCOUNTER — Telehealth: Payer: Self-pay

## 2018-06-20 NOTE — Telephone Encounter (Signed)
Patient states he feels better than Monday, today he feel also a little bit weak, he is doing eveything as the provider told him to do it. He said he do not have any question or concern to share with [place].

## 2018-09-04 ENCOUNTER — Ambulatory Visit (INDEPENDENT_AMBULATORY_CARE_PROVIDER_SITE_OTHER): Payer: Self-pay | Admitting: Podiatry

## 2018-09-04 ENCOUNTER — Other Ambulatory Visit: Payer: Self-pay

## 2018-09-04 ENCOUNTER — Other Ambulatory Visit: Payer: Self-pay | Admitting: Podiatry

## 2018-09-04 ENCOUNTER — Ambulatory Visit (INDEPENDENT_AMBULATORY_CARE_PROVIDER_SITE_OTHER): Payer: Self-pay

## 2018-09-04 ENCOUNTER — Encounter: Payer: Self-pay | Admitting: Podiatry

## 2018-09-04 VITALS — Temp 96.2°F

## 2018-09-04 DIAGNOSIS — M722 Plantar fascial fibromatosis: Secondary | ICD-10-CM

## 2018-09-04 DIAGNOSIS — M778 Other enthesopathies, not elsewhere classified: Secondary | ICD-10-CM

## 2018-09-04 DIAGNOSIS — M779 Enthesopathy, unspecified: Secondary | ICD-10-CM

## 2018-09-04 MED ORDER — METHYLPREDNISOLONE 4 MG PO TBPK: ORAL_TABLET | ORAL | 0 refills | Status: DC

## 2018-09-04 MED ORDER — MELOXICAM 15 MG PO TABS: 15.0000 mg | ORAL_TABLET | Freq: Every day | ORAL | 2 refills | Status: DC

## 2018-09-06 NOTE — Progress Notes (Signed)
   Subjective: 48 y.o. male presenting today with a chief complaint of intermittent burning pain to the right plantar forefoot that began about two months ago. He notes sharp pain to the left heel that also began two months ago. Standing from a seated position increases the pain. He has been massaging the feet and taking Aleve for treatment. Patient is here for further evaluation and treatment.    Past Medical History:  Diagnosis Date  . GERD (gastroesophageal reflux disease)   . Kidney calculi   . Traumatic complete tear of right rotator cuff 10/18/2017     Objective: Physical Exam General: The patient is alert and oriented x3 in no acute distress.  Dermatology: Skin is warm, dry and supple bilateral lower extremities. Negative for open lesions or macerations bilateral.   Vascular: Dorsalis Pedis and Posterior Tibial pulses palpable bilateral.  Capillary fill time is immediate to all digits.  Neurological: Epicritic and protective threshold intact bilateral.   Musculoskeletal: Tenderness to palpation to the plantar aspect of the right heel along the plantar fascia. All other joints range of motion within normal limits bilateral. Strength 5/5 in all groups bilateral.   Radiographic exam: Normal osseous mineralization. Joint spaces preserved. No fracture/dislocation/boney destruction. No other soft tissue abnormalities or radiopaque foreign bodies.   Assessment: 1. Plantar fasciitis right  Plan of Care:  1. Patient evaluated. Xrays reviewed.   2. Injection of 0.5cc Celestone soluspan injected into the right plantar fascia  3. Rx for Medrol Dose Pack placed 4. Rx for Meloxicam ordered for patient. 5. Continue using OTC insoles.  6. Instructed patient regarding therapies and modalities at home to alleviate symptoms.  7. Return to clinic as needed.  Does hardwood flooring.      Edrick Kins, DPM [facility]  Dr. Edrick Kins, DPM    [address], [place] [address]                Office [phone]  Fax [phone]

## 2020-08-12 ENCOUNTER — Ambulatory Visit
Admission: RE | Admit: 2020-08-12 | Discharge: 2020-08-12 | Disposition: A | Discharge status: Home / Self Care | Payer: Managed Care, Other (non HMO) | Attending: Internal Medicine | Admitting: Internal Medicine

## 2020-08-12 ENCOUNTER — Encounter: Payer: Self-pay | Admitting: Internal Medicine

## 2020-08-12 ENCOUNTER — Ambulatory Visit: Payer: Managed Care, Other (non HMO) | Admitting: Internal Medicine

## 2020-08-12 ENCOUNTER — Ambulatory Visit
Admission: RE | Admit: 2020-08-12 | Discharge: 2020-08-12 | Disposition: A | Discharge status: Home / Self Care | Payer: Managed Care, Other (non HMO) | Source: Ambulatory Visit | Attending: Internal Medicine | Admitting: Internal Medicine

## 2020-08-12 ENCOUNTER — Other Ambulatory Visit: Payer: Self-pay

## 2020-08-12 VITALS — BP 134/86 | HR 79 | Temp 97.8°F | Ht 69.88 in | Wt 191.2 lb

## 2020-08-12 DIAGNOSIS — L299 Pruritus, unspecified: Secondary | ICD-10-CM | POA: Diagnosis not present

## 2020-08-12 DIAGNOSIS — N400 Enlarged prostate without lower urinary tract symptoms: Secondary | ICD-10-CM | POA: Diagnosis not present

## 2020-08-12 DIAGNOSIS — K219 Gastro-esophageal reflux disease without esophagitis: Secondary | ICD-10-CM | POA: Diagnosis not present

## 2020-08-12 DIAGNOSIS — Z125 Encounter for screening for malignant neoplasm of prostate: Secondary | ICD-10-CM

## 2020-08-12 DIAGNOSIS — R21 Rash and other nonspecific skin eruption: Secondary | ICD-10-CM

## 2020-08-12 DIAGNOSIS — K625 Hemorrhage of anus and rectum: Secondary | ICD-10-CM | POA: Diagnosis not present

## 2020-08-12 DIAGNOSIS — R222 Localized swelling, mass and lump, trunk: Secondary | ICD-10-CM | POA: Insufficient documentation

## 2020-08-12 MED ORDER — FEXOFENADINE HCL 180 MG PO TABS: 180.0000 mg | ORAL_TABLET | Freq: Every day | ORAL | Status: DC

## 2020-08-12 MED ORDER — FEXOFENADINE HCL 180 MG PO TABS: 180.0000 mg | ORAL_TABLET | Freq: Every day | ORAL | 2 refills | Status: AC

## 2020-08-12 MED ORDER — FLUTICASONE PROPIONATE 50 MCG/ACT NA SUSP: 2.0000 | Freq: Every day | NASAL | 6 refills | Status: AC

## 2020-08-12 MED ORDER — DEXLANSOPRAZOLE 30 MG PO CPDR: 30.0000 mg | DELAYED_RELEASE_CAPSULE | Freq: Every day | ORAL | 6 refills | Status: AC

## 2020-08-12 NOTE — Progress Notes (Signed)
BP 134/86   Pulse 79   Temp 97.8 F (36.6 C) (Oral)   Ht 5' 9.88" (1.775 m)   Wt 191 lb 3.2 oz (86.7 kg)   SpO2 96%   BMI 27.53 kg/m    Subjective:    Patient ID: Reginald [name], male    DOB: May 22, 1970, 50 y.o.   MRN: [id number]  HPI: Alphons Burgert How is a 50 y.o. male  Pt is new to the practice has had seasonal allergies, ? Cat allergies partner has cats, itcing around buttocks - tried otc meds -  cortisone cream / benadryl cream diaper rash cream co some blood tinged blower movts.   Sinus Problem This is a chronic problem. Pertinent negatives include no coughing, headaches, hoarse voice or sore throat.  Gastroesophageal Reflux He complains of water brash. He reports no abdominal pain, no chest pain, no choking, no coughing, no dysphagia, no early satiety, no globus sensation, no heartburn, no hoarse voice, no nausea, no sore throat, no stridor, no tooth decay or no wheezing. has some " lump" after he eats - feels like this : when he eats, gets off of work around 9 : 30 pm and then eats..  Rectal Bleeding  The current episode started more than 1 week ago. The onset is undetermined. The problem occurs frequently. The stool is described as streaked with blood. Pertinent negatives include no anorexia, no fever, no abdominal pain, no diarrhea, no hemorrhoids, no nausea, no rectal pain, no vomiting, no hematuria, no vaginal bleeding, no vaginal discharge, no chest pain, no headaches, no coughing, no difficulty breathing and no rash.    Chief Complaint  Patient presents with  . New Patient (Initial Visit)    Patient is here today to establish care, he has a few concerns: 1 Allergies?, 2. Lump in middle of chest after eating which causes acid reflux, and 3. Has issue with buttock has been itching of last 1 1/2 years, patient states that he thought it was a hemorrhoid but not sure due to the length of time.    Relevant past medical, surgical, family and social history reviewed and updated as  indicated. Interim medical history since our last visit reviewed. Allergies and medications reviewed and updated.  Review of Systems  Constitutional: Negative for fever.  HENT: Negative for hoarse voice and sore throat.   Respiratory: Negative for cough, choking and wheezing.   Cardiovascular: Negative for chest pain.  Gastrointestinal: Positive for hematochezia. Negative for abdominal pain, anorexia, diarrhea, dysphagia, heartburn, hemorrhoids, nausea, rectal pain and vomiting.  Genitourinary: Negative for hematuria, vaginal bleeding and vaginal discharge.  Skin: Negative for rash.  Neurological: Negative for headaches.    Per HPI unless specifically indicated above     Objective:    BP 134/86   Pulse 79   Temp 97.8 F (36.6 C) (Oral)   Ht 5' 9.88" (1.775 m)   Wt 191 lb 3.2 oz (86.7 kg)   SpO2 96%   BMI 27.53 kg/m   Wt Readings from Last 3 Encounters:  08/12/20 191 lb 3.2 oz (86.7 kg)  06/18/18 181 lb (82.1 kg)  11/14/17 183 lb (83 kg)    Physical Exam  No results found for this or any previous visit.      Current Outpatient Medications:  .  albuterol (PROVENTIL HFA;VENTOLIN HFA) 108 (90 Base) MCG/ACT inhaler, Inhale 2 puffs into the lungs every 4 (four) hours as needed., Disp: 1 Inhaler, Rfl: 0 .  lansoprazole (PREVACID) 30 MG  capsule, Take 30 mg by mouth daily at 12 noon., Disp: , Rfl:  .  meloxicam (MOBIC) 15 MG tablet, Take 1 tablet (15 mg total) by mouth daily. (Patient not taking: Reported on 08/12/2020), Disp: 30 tablet, Rfl: 2 .  methylPREDNISolone (MEDROL DOSEPAK) 4 MG TBPK tablet, 6 day dose pack - take as directed (Patient not taking: Reported on 08/12/2020), Disp: 21 tablet, Rfl: 0    Assessment & Plan:  1. Rectal bleed / mucoid d/c / itching ? Anal  ? Sec to hemmorrhoids will refer to GI for such   2. Rash ? targetoid per pt had a tick bite in this area.  Check lymes and other tick borne dz as below Start doxy x 2 weeks for px.    3. Allergic  rhinitis  Orders Placed This Encounter  Procedures  . Microscopic Examination  . DG Chest 2 View    Order Specific Question:   Reason for Exam (SYMPTOM  OR DIAGNOSIS REQUIRED)    Answer:   lump on chest wall    Order Specific Question:   Preferred imaging location?    Answer:   [facility] Abd 2 Views    Order Specific Question:   Reason for Exam (SYMPTOM  OR DIAGNOSIS REQUIRED)    Answer:   lump epigastric area    Order Specific Question:   Preferred imaging location?    Answer:   [facility]  . TSH  . PSA  . Urinalysis, Routine w reflex microscopic  . Rocky mtn spotted fvr abs pnl(IgG+IgM)  . Ehrlichia antibody panel  . Babesia microti Antibody Panel  . Comprehensive metabolic panel  . CBC with Differential/Platelet  . Lyme Ab/Western Blot Reflex  . Ambulatory referral to Urology    Referral Priority:   Urgent    Referral Type:   Consultation    Referral Reason:   Specialty Services Required    Requested Specialty:   Urology    Number of Visits Requested:   1  . Ambulatory referral to Gastroenterology    Referral Priority:   Urgent    Referral Type:   Consultation    Referral Reason:   Specialty Services Required    Number of Visits Requested:   1    Orders Placed This Encounter  Procedures  . Microscopic Examination  . DG Chest 2 View    Order Specific Question:   Reason for Exam (SYMPTOM  OR DIAGNOSIS REQUIRED)    Answer:   lump on chest wall    Order Specific Question:   Preferred imaging location?    Answer:   [facility] Abd 2 Views    Order Specific Question:   Reason for Exam (SYMPTOM  OR DIAGNOSIS REQUIRED)    Answer:   lump epigastric area    Order Specific Question:   Preferred imaging location?    Answer:   [facility]  . TSH  . PSA  . Urinalysis, Routine w reflex microscopic  . Rocky mtn spotted fvr abs pnl(IgG+IgM)  . Ehrlichia antibody panel  . Babesia microti Antibody Panel  . Comprehensive metabolic panel  . CBC  with Differential/Platelet  . Lyme Ab/Western Blot Reflex  . Ambulatory referral to Urology    Referral Priority:   Urgent    Referral Type:   Consultation    Referral Reason:   Specialty Services Required    Requested Specialty:   Urology    Number of Visits Requested:  1  . Ambulatory referral to Gastroenterology    Referral Priority:   Urgent    Referral Type:   Consultation    Referral Reason:   Specialty Services Required    Number of Visits Requested:   1    3. H /O  BPH :  Refer to urology.  Patient also has microscopic hematuria noted on the UA.  Question needs work-up for nephrolithiasis. Patient does not complain of any abdominal pain PSA within normal limits  4. GERD ? Chest wall / epigastric discomfort/abnormality noticed per patient when he eats and swallows postprandial.  Will refer to GI probably needs an endoscopy upper and evaluation for query hemorrhoids secondary to constant mucus/blood in his stool query secondary to hemorrhoids versus other etiologies mass : check CXR and AXR secondary to an epigastric/lower chest lump noticed by patient  Problem List Items Addressed This Visit      Digestive   Acid reflux - Primary   Relevant Medications   Dexlansoprazole (DEXILANT) 30 MG capsule   Other Relevant Orders   Ambulatory referral to Gastroenterology    Other Visit Diagnoses    Itching       Relevant Orders   TSH (Completed)   Rocky mtn spotted fvr abs pnl(IgG+IgM) (Completed)   Ehrlichia antibody panel (Completed)   Babesia microti Antibody Panel (Completed)   Benign prostatic hyperplasia without lower urinary tract symptoms       Relevant Orders   Ambulatory referral to Urology   Rectal bleed       Relevant Orders   TSH (Completed)   PSA (Completed)   Urinalysis, Routine w reflex microscopic (Completed)   Rocky mtn spotted fvr abs pnl(IgG+IgM) (Completed)   Ehrlichia antibody panel (Completed)   Babesia microti Antibody Panel (Completed)    Comprehensive metabolic panel (Completed)   CBC with Differential/Platelet (Completed)   Lyme Ab/Western Blot Reflex   Screening for prostate cancer       Relevant Orders   PSA (Completed)   Rash       Relevant Orders   Rocky mtn spotted fvr abs pnl(IgG+IgM) (Completed)   Ehrlichia antibody panel (Completed)   Babesia microti Antibody Panel (Completed)   Lyme Ab/Western Blot Reflex   Chest wall mass       Relevant Orders   DG Chest 2 View (Completed)   DG Abd 2 Views (Completed)       Follow up plan: No follow-ups on file.

## 2020-08-13 ENCOUNTER — Encounter: Payer: Self-pay | Admitting: *Deleted

## 2020-08-13 LAB — URINALYSIS, ROUTINE W REFLEX MICROSCOPIC
Bilirubin, UA: NEGATIVE
Glucose, UA: NEGATIVE
Ketones, UA: NEGATIVE
Leukocytes,UA: NEGATIVE
Nitrite, UA: NEGATIVE
Protein,UA: NEGATIVE
Specific Gravity, UA: >1.03 — ABNORMAL HIGH (ref 1.005–1.030)
Urobilinogen, Ur: 0.2 mg/dL (ref 0.2–1.0)
pH, UA: 5.5 (ref 5.0–7.5)

## 2020-08-13 LAB — MICROSCOPIC EXAMINATION
Bacteria, UA: NONE SEEN
WBC, UA: NONE SEEN /hpf (ref 0–5)

## 2020-08-13 LAB — LYME DISEASE SEROLOGY W/REFLEX: Lyme Total Antibody EIA: NEGATIVE

## 2020-08-13 MED ORDER — DOXYCYCLINE HYCLATE 100 MG PO TABS: 100.0000 mg | ORAL_TABLET | Freq: Two times a day (BID) | ORAL | 0 refills | Status: AC

## 2020-08-13 NOTE — Progress Notes (Signed)
Please let pt know this was normal.

## 2020-08-15 LAB — CBC WITH DIFFERENTIAL/PLATELET
Basophils Absolute: 0.1 10*3/uL (ref 0.0–0.2)
Basos: 1 %
EOS (ABSOLUTE): 0.4 10*3/uL (ref 0.0–0.4)
Eos: 5 %
Hematocrit: 46.9 % (ref 37.5–51.0)
Hemoglobin: 15.7 g/dL (ref 13.0–17.7)
Immature Grans (Abs): 0 10*3/uL (ref 0.0–0.1)
Immature Granulocytes: 0 %
Lymphocytes Absolute: 1.6 10*3/uL (ref 0.7–3.1)
Lymphs: 18 %
MCH: 27.4 pg (ref 26.6–33.0)
MCHC: 33.5 g/dL (ref 31.5–35.7)
MCV: 82 fL (ref 79–97)
Monocytes Absolute: 0.6 10*3/uL (ref 0.1–0.9)
Monocytes: 7 %
Neutrophils Absolute: 5.8 10*3/uL (ref 1.4–7.0)
Neutrophils: 69 %
Platelets: 220 10*3/uL (ref 150–450)
RBC: 5.72 x10E6/uL (ref 4.14–5.80)
RDW: 12.6 % (ref 11.6–15.4)
WBC: 8.4 10*3/uL (ref 3.4–10.8)

## 2020-08-15 LAB — BABESIA MICROTI ANTIBODY PANEL
Babesia microti IgG: <1:10 {titer}
Babesia microti IgM: <1:10 {titer}

## 2020-08-15 LAB — COMPREHENSIVE METABOLIC PANEL
ALT: 52 IU/L — ABNORMAL HIGH (ref 0–44)
AST: 33 IU/L (ref 0–40)
Albumin/Globulin Ratio: 1.9 (ref 1.2–2.2)
Albumin: 4.9 g/dL (ref 4.0–5.0)
Alkaline Phosphatase: 97 IU/L (ref 44–121)
BUN/Creatinine Ratio: 13 (ref 9–20)
BUN: 17 mg/dL (ref 6–24)
Bilirubin Total: 0.6 mg/dL (ref 0.0–1.2)
CO2: 24 mmol/L (ref 20–29)
Calcium: 9.6 mg/dL (ref 8.7–10.2)
Chloride: 102 mmol/L (ref 96–106)
Creatinine, Ser: 1.28 mg/dL — ABNORMAL HIGH (ref 0.76–1.27)
Globulin, Total: 2.6 g/dL (ref 1.5–4.5)
Glucose: 74 mg/dL (ref 65–99)
Potassium: 4.2 mmol/L (ref 3.5–5.2)
Sodium: 141 mmol/L (ref 134–144)
Total Protein: 7.5 g/dL (ref 6.0–8.5)
eGFR: 69 mL/min/{1.73_m2} (ref >59)

## 2020-08-15 LAB — EHRLICHIA ANTIBODY PANEL
E. Chaffeensis (HME) IgM Titer: NEGATIVE
E.Chaffeensis (HME) IgG: NEGATIVE
HGE IgG Titer: NEGATIVE
HGE IgM Titer: NEGATIVE

## 2020-08-15 LAB — ROCKY MTN SPOTTED FVR ABS PNL(IGG+IGM)
RMSF IgG: UNDETERMINED
RMSF IgM: 0.44 index (ref 0.00–0.89)

## 2020-08-15 LAB — PSA: Prostate Specific Ag, Serum: 0.6 ng/mL (ref 0.0–4.0)

## 2020-08-15 LAB — RMSF, IGG, IFA: RMSF, IGG, IFA: 1:64 {titer} — ABNORMAL HIGH

## 2020-08-15 LAB — TSH: TSH: 4.8 u[IU]/mL — ABNORMAL HIGH (ref 0.450–4.500)

## 2020-08-18 ENCOUNTER — Other Ambulatory Visit: Payer: Self-pay

## 2020-08-18 ENCOUNTER — Encounter: Payer: Self-pay | Admitting: Gastroenterology

## 2020-08-18 ENCOUNTER — Ambulatory Visit: Payer: Managed Care, Other (non HMO) | Admitting: Gastroenterology

## 2020-08-18 VITALS — BP 128/80 | HR 99 | Ht 69.0 in | Wt 193.0 lb

## 2020-08-18 DIAGNOSIS — Z1211 Encounter for screening for malignant neoplasm of colon: Secondary | ICD-10-CM

## 2020-08-18 DIAGNOSIS — K219 Gastro-esophageal reflux disease without esophagitis: Secondary | ICD-10-CM | POA: Diagnosis not present

## 2020-08-18 MED ORDER — NA SULFATE-K SULFATE-MG SULF 17.5-3.13-1.6 GM/177ML PO SOLN: 1.0000 | Freq: Once | ORAL | 0 refills | Status: AC

## 2020-08-18 MED ORDER — OMEPRAZOLE 40 MG PO CPDR: 40.0000 mg | DELAYED_RELEASE_CAPSULE | Freq: Every day | ORAL | 0 refills | Status: DC

## 2020-08-18 NOTE — Progress Notes (Signed)
Reginald Bellows MD, MRCP(U.K) [address]  [place]  [place], [place] [address]  Main: [phone]  Fax: [phone]   Gastroenterology Consultation  Referring Provider:     Charlynne Cousins, MD Primary Care Physician:  Patient, No Pcp Per (Inactive) Primary Gastroenterologist:  Dr. Jonathon [name]  Reason for Consultation:     GERD        HPI:   Reginald [name] is a 50 y.o. y/o male referred for consultation & management for GERD.    Reflux:  Onset : Few years Symptoms: Every night heartburn and epigastric discomfort especially when he lays flat Recent weight gain: Yes Medications: Tums as needed Narcotics or anticholinergics use : None PPI /H2 blockers or Antacid  use and timing : Has tried Nexium as needed  Prior EGD: None Family history of esophageal cancer: None  Not a smoker, never had a colonoscopy, father had colon polyps.  No change in bowel habits.  Past Medical History:  Diagnosis Date  . Allergy   . GERD (gastroesophageal reflux disease)   . Kidney calculi   . Traumatic complete tear of right rotator cuff 10/18/2017    Past Surgical History:  Procedure Laterality Date  . LIPOMA EXCISION Left    side  . SHOULDER ARTHROSCOPY WITH BICEPS TENDON REPAIR Right 11/14/2017   Procedure: SHOULDER ARTHROSCOPY WITH BICEPS TENDON REPAIR;  Surgeon: Elsie Saas, MD;  Location: [facility];  Service: Orthopedics;  Laterality: Right;  . SHOULDER ARTHROSCOPY WITH ROTATOR CUFF REPAIR AND SUBACROMIAL DECOMPRESSION Right 11/14/2017   Procedure: SHOULDER ARTHROSCOPY WITH ROTATOR CUFF REPAIR AND SUBACROMIAL DECOMPRESSION AND LABRAL DEBRIDMENT;  Surgeon: Elsie Saas, MD;  Location: [facility];  Service: Orthopedics;  Laterality: Right;    Prior to Admission medications   Medication Sig Start Date End Date Taking? Authorizing Provider  albuterol (PROVENTIL HFA;VENTOLIN HFA) 108 (90 Base) MCG/ACT inhaler Inhale 2 puffs into the lungs every 4 (four)  hours as needed. 06/18/18   Darlin Priestly, PA-C  Dexlansoprazole (DEXILANT) 30 MG capsule Take 1 capsule (30 mg total) by mouth daily. 08/12/20   Vigg, Avanti, MD  doxycycline (VIBRA-TABS) 100 MG tablet Take 1 tablet (100 mg total) by mouth 2 (two) times daily for 14 days. 08/13/20 08/27/20  Charlynne Cousins, MD  fexofenadine (ALLEGRA ALLERGY) 180 MG tablet Take 1 tablet (180 mg total) by mouth daily. 08/12/20   Vigg, Avanti, MD  fluticasone (FLONASE) 50 MCG/ACT nasal spray Place 2 sprays into both nostrils daily. 08/12/20   Vigg, Avanti, MD  meloxicam (MOBIC) 15 MG tablet Take 1 tablet (15 mg total) by mouth daily. Patient not taking: Reported on 08/12/2020 09/04/18   Edrick Kins, DPM  methylPREDNISolone (MEDROL DOSEPAK) 4 MG TBPK tablet 6 day dose pack - take as directed Patient not taking: Reported on 08/12/2020 09/04/18   Edrick Kins, DPM    Family History  Problem Relation Age of Onset  . Arthritis Mother   . Hyperlipidemia Mother   . Hypertension Father      Social History   Tobacco Use  . Smoking status: Never Smoker  . Smokeless tobacco: Never Used  Vaping Use  . Vaping Use: Never used  Substance Use Topics  . Alcohol use: Not Currently  . Drug use: Never    Allergies as of 08/18/2020 - Review Complete 08/18/2020  Allergen Reaction Noted  . Yeast-related products Hives 08/12/2020    Review of Systems:    All systems reviewed and negative except where  noted in HPI.   Physical Exam:  BP 128/80 (BP Location: Left Arm, Patient Position: Sitting, Cuff Size: Normal)   Pulse 99   Ht 5\' 9"  (1.753 m)   Wt 193 lb (87.5 kg)   BMI 28.50 kg/m  No LMP for male patient. Psych:  Alert and cooperative. Normal mood and affect. General:   Alert,  Well-developed, well-nourished, pleasant and cooperative in NAD Head:  Normocephalic and atraumatic. Eyes:  Sclera clear, no icterus.   Conjunctiva pink. Lungs:  Respirations even and unlabored.  Clear throughout to auscultation.   No  wheezes, crackles, or rhonchi. No acute distress. Heart:  Regular rate and rhythm; no murmurs, clicks, rubs, or gallops. Abdomen:  Normal bowel sounds.  No bruits.  Soft, non-tender and non-distended without masses, hepatosplenomegaly or hernias noted.  No guarding or rebound tenderness.    Neurologic:  Alert and oriented x3;  grossly normal neurologically. Psych:  Alert and cooperative. Normal mood and affect.  Imaging Studies: DG Chest 2 View  Result Date: 08/12/2020 CLINICAL DATA:  Chest wall lump. EXAM: CHEST - 2 VIEW COMPARISON:  None. FINDINGS: The heart size and mediastinal contours are within normal limits. Both lungs are clear. The visualized skeletal structures are unremarkable. IMPRESSION: No active cardiopulmonary disease. Electronically Signed   By: Marijo Conception M.D.   On: 08/12/2020 15:08   DG Abd 2 Views  Result Date: 08/12/2020 CLINICAL DATA:  Epigastric lump. EXAM: ABDOMEN - 2 VIEW COMPARISON:  None. FINDINGS: The bowel gas pattern is normal. There is no evidence of free air. No radio-opaque calculi or other significant radiographic abnormality is seen. IMPRESSION: Negative. Electronically Signed   By: Marijo Conception M.D.   On: 08/12/2020 15:09    Assessment and Plan:   Reginald [name] is a 50 y.o. y/o male has been referred for GERD   Plan   1. GERD : Commence on Prilosec 40 mg once a day first thing in the morning on an empty stomach trial for 6 to 8 weeks . counseled on life style changes, suggest to use PPI first thing in the morning on empty stomach and eat 30 minutes after. Advised on the use of a wedge pillow at night , avoid meals for 2 hours prior to bed time. Weight loss .Discussed the risks and benefits of long term PPI use including but not limited to bone loss, chronic kidney disease, infections , low magnesium . Aim to use at the lowest dose for the shortest period of time   2.  Colonoscopy for colon cancer screening with a family history of colon polyps in his  father  I have discussed alternative options, risks & benefits,  which include, but are not limited to, bleeding, infection, perforation,respiratory complication & drug reaction.  The patient agrees with this plan & written consent will be obtained.     Follow up in 8 weeks  Dr Reginald Bellows MD,MRCP(U.K)

## 2020-08-18 NOTE — Progress Notes (Addendum)
08/20/2020  8:43 AM   Reginald [name] 11-22-1970 [id number]  Referring provider: Loura Pardon, MD [address] [place],  [place] [address] Chief Complaint  Patient presents with  . Benign Prostatic Hypertrophy    HPI: Reginald [name] is a 50 y.o. male with a personal history of BPH without LUTS, who presents today for evaluation and management of this.  Patient was recently seen on 08/12/2020 by [facility], and was referred by Dr. Loura Pardon. It was noted that he had " hematuria" noted on the UA. His PSA was 0.6. It was recommended that he have a workup for nephrolithiasis and be referred to clinic.    Please refer to labs section to see most recent results for UA, CBC, and CMP.  When he was 27 he underwent urethral dilation for scarring, and he was catheterized for about a year and a half. He had enlarged prostate at the time, but has not been to the doctor since.    He said that his stream has been good and that he has been emptying well.  He passed two kidney stones on his own in 2014/2015. They told him that he had a lot of small stones. He drinks a lot of sweet tea, but does not drink alcohol or smoke. He said that he has been drinking about a gallon and a half of sweet tea per day with about 2 cups of sugar in each gallon.  UA was positive for trace blood on dip but microscopic showed no RBC.  He said that the reason he went to the doctor was from a clear secretion that was coming out of his anus, and this caused a rash. He was diagnosed with [facility] Fever.   IPSS    Row Name 08/19/20 1000         International Prostate Symptom Score   How often have you had the sensation of not emptying your bladder? Less than 1 in 5     How often have you had to urinate less than every two hours? Less than half the time     How often have you found you stopped and started again several times when you urinated? Not at All     How often have you found it difficult to postpone  urination? Not at All     How often have you had a weak urinary stream? Not at All     How often have you had to strain to start urination? Less than half the time     How many times did you typically get up at night to urinate? 1 Time     Total IPSS Score 6           Quality of Life due to urinary symptoms   If you were to spend the rest of your life with your urinary condition just the way it is now how would you feel about that? Mostly Satisfied            Score:  1-7 Mild 8-19 Moderate 20-35 Severe   PMH: Past Medical History:  Diagnosis Date  . Allergy   . GERD (gastroesophageal reflux disease)   . Kidney calculi   . Traumatic complete tear of right rotator cuff 10/18/2017    Surgical History: Past Surgical History:  Procedure Laterality Date  . LIPOMA EXCISION Left    side  . SHOULDER ARTHROSCOPY WITH BICEPS TENDON REPAIR Right 11/14/2017   Procedure: SHOULDER ARTHROSCOPY  WITH BICEPS TENDON REPAIR;  Surgeon: Elsie Saas, MD;  Location: [place];  Service: Orthopedics;  Laterality: Right;  . SHOULDER ARTHROSCOPY WITH ROTATOR CUFF REPAIR AND SUBACROMIAL DECOMPRESSION Right 11/14/2017   Procedure: SHOULDER ARTHROSCOPY WITH ROTATOR CUFF REPAIR AND SUBACROMIAL DECOMPRESSION AND LABRAL DEBRIDMENT;  Surgeon: Elsie Saas, MD;  Location: [place];  Service: Orthopedics;  Laterality: Right;    Home Medications:  Allergies as of 08/19/2020      Reactions   Yeast-related Products Hives      Medication List       Accurate as of [place] 4, 2022 11:59 PM. If you have any questions, ask your nurse or doctor.        STOP taking these medications   albuterol 108 (90 Base) MCG/ACT inhaler Commonly known as: VENTOLIN HFA   methylPREDNISolone 4 MG Tbpk tablet Commonly known as: MEDROL DOSEPAK     TAKE these medications   Dexlansoprazole 30 MG capsule Commonly known as: Dexilant Take 1 capsule (30 mg total) by mouth daily.   doxycycline  100 MG tablet Commonly known as: VIBRA-TABS Take 1 tablet (100 mg total) by mouth 2 (two) times daily for 14 days.   fexofenadine 180 MG tablet Commonly known as: Allegra Allergy Take 1 tablet (180 mg total) by mouth daily.   fluticasone 50 MCG/ACT nasal spray Commonly known as: FLONASE Place 2 sprays into both nostrils daily.   meloxicam 15 MG tablet Commonly known as: MOBIC Take 1 tablet (15 mg total) by mouth daily.   omeprazole 40 MG capsule Commonly known as: PRILOSEC Take 1 capsule (40 mg total) by mouth daily.       Allergies: Allergies  Allergen Reactions  . Yeast-Related Products Hives    Family History: Family History  Problem Relation Age of Onset  . Arthritis Mother   . Hyperlipidemia Mother   . Hypertension Father     Social History:   reports that he has never smoked. He has never used smokeless tobacco. He reports previous alcohol use. He reports that he does not use drugs.  ROS: Pertinent ROS in HPI.  Physical Exam: BP 128/88   Pulse 71   Ht $R'5\' 9"'kG$  (1.753 m)   Wt 193 lb (87.5 kg)   BMI 28.50 kg/m   Constitutional:  Alert and oriented, No acute distress. HEENT: [place] AT, moist mucus membranes.  Trachea midline, no masses. Cardiovascular: No clubbing, cyanosis, or edema. Respiratory: Normal respiratory effort, no increased work of breathing. Skin: No rashes, bruises or suspicious lesions. Neurologic: Grossly intact, no focal deficits, moving all 4 extremities. Psychiatric: Normal mood and affect.  Laboratory Data:  CBC: Component Ref Range & Units 6 d ago  (08/12/20) 6 d ago  (08/12/20)  WBC 3.4 - 10.8 x10E3/uL 8.4    RBC 4.14 - 5.80 x10E6/uL 5.72  0-2 R   Hemoglobin 13.0 - 17.7 g/dL 15.7    Hematocrit 37.5 - 51.0 % 46.9    MCV 79 - 97 fL 82    MCH 26.6 - 33.0 pg 27.4    MCHC 31.5 - 35.7 g/dL 33.5    RDW 11.6 - 15.4 % 12.6    Platelets 150 - 450 x10E3/uL 220    Neutrophils Not Estab. % 69    Lymphs Not Estab. % 18    Monocytes Not  Estab. % 7    Eos Not Estab. % 5    Basos Not Estab. % 1    Neutrophils Absolute 1.4 - 7.0  x10E3/uL 5.8    Lymphocytes Absolute 0.7 - 3.1 x10E3/uL 1.6    Monocytes Absolute 0.1 - 0.9 x10E3/uL 0.6    EOS (ABSOLUTE) 0.0 - 0.4 x10E3/uL 0.4    Basophils Absolute 0.0 - 0.2 x10E3/uL 0.1    Immature Granulocytes Not Estab. % 0    Immature Grans (Abs) 0.0 - 0.1 x10E3/uL 0.0    WBC, UA   None seen R   Epithelial Cells (non renal)   0-10 R   Mucus, UA   PresentAbnormal R   Bacteria, UA   None seen R      CMP:  Ref Range & Units 6 d ago  Glucose 65 - 99 mg/dL 74   BUN 6 - 24 mg/dL 17   Creatinine, Ser 0.76 - 1.27 mg/dL 1.28High   eGFR >59 mL/min/1.73 69   BUN/Creatinine Ratio 9 - 20 13   Sodium 134 - 144 mmol/L 141   Potassium 3.5 - 5.2 mmol/L 4.2   Chloride 96 - 106 mmol/L 102   CO2 20 - 29 mmol/L 24   Calcium 8.7 - 10.2 mg/dL 9.6   Total Protein 6.0 - 8.5 g/dL 7.5   Albumin 4.0 - 5.0 g/dL 4.9   Globulin, Total 1.5 - 4.5 g/dL 2.6   Albumin/Globulin Ratio 1.2 - 2.2 1.9   Bilirubin Total 0.0 - 1.2 mg/dL 0.6   Alkaline Phosphatase 44 - 121 IU/L 97   AST 0 - 40 IU/L 33   ALT 0 - 44 IU/L 52High    UA with microscopic:  Ref Range & Units 6 d ago  Specific Gravity, UA 1.005 - 1.030 >1.030High   pH, UA 5.0 - 7.5 5.5   Color, UA Yellow Yellow   Appearance Ur Clear Clear   Leukocytes,UA Negative Negative   Protein,UA Negative/Trace Negative   Glucose, UA Negative Negative   Ketones, UA Negative Negative   RBC, UA Negative TraceAbnormal   Bilirubin, UA Negative Negative   Urobilinogen, Ur 0.2 - 1.0 mg/dL 0.2   Nitrite, UA Negative Negative     Ref Range & Units 6 d ago  WBC, UA 0 - 5 /hpf None seen   RBC 0 - 2 /hpf 0-2   Epithelial Cells (non renal) 0 - 10 /hpf 0-10   Mucus, UA Not Estab. PresentAbnormal   Bacteria, UA None seen/Few None seen     Lab Results  Component Value Date   CREATININE 1.28 (H) 08/12/2020    Lab Results  Component Value Date   TSH  4.800 (H) 08/12/2020   PSA 0.6 on 08/12/2020   I have reviewed the labs.  Assessment & Plan:    1. Abnormal UA Does not meet the criteria for microscopic hematuria which is defined as 3 or more RBC for high powered field-no work-up warranted at this   2. Urethral stricture Asymptomatic, no clinical  3. History of stones No recent stones in past 15 years.  Discussed dietary modifications and drinking about 2.5 liters per day of water-cut back on sweet tea  KUB today for baseline  4. History of enlarged prostate Clinically asymptomatic.  PSA stable.  Will return if becomes symptomatic  Follow Up:  prn  I, Ardyth Gal, am acting as a scribe for Dr. Hollice Espy.   I have reviewed the above documentation for accuracy and completeness, and I agree with the above.   Hollice Espy, MD    [facility] [address], [place],  [address] [phone]

## 2020-08-19 ENCOUNTER — Ambulatory Visit
Admission: RE | Admit: 2020-08-19 | Discharge: 2020-08-19 | Disposition: A | Discharge status: Home / Self Care | Payer: Managed Care, Other (non HMO) | Attending: Urology | Admitting: Urology

## 2020-08-19 ENCOUNTER — Ambulatory Visit
Admission: RE | Admit: 2020-08-19 | Discharge: 2020-08-19 | Disposition: A | Discharge status: Home / Self Care | Payer: Managed Care, Other (non HMO) | Source: Ambulatory Visit | Attending: Urology | Admitting: Urology

## 2020-08-19 ENCOUNTER — Ambulatory Visit (INDEPENDENT_AMBULATORY_CARE_PROVIDER_SITE_OTHER): Payer: Managed Care, Other (non HMO) | Admitting: Urology

## 2020-08-19 VITALS — BP 128/88 | HR 71 | Ht 69.0 in | Wt 193.0 lb

## 2020-08-19 DIAGNOSIS — N200 Calculus of kidney: Secondary | ICD-10-CM

## 2020-08-19 DIAGNOSIS — N400 Enlarged prostate without lower urinary tract symptoms: Secondary | ICD-10-CM

## 2020-08-19 LAB — URINALYSIS, COMPLETE
Bilirubin, UA: NEGATIVE
Glucose, UA: NEGATIVE
Ketones, UA: NEGATIVE
Leukocytes,UA: NEGATIVE
Nitrite, UA: NEGATIVE
Protein,UA: NEGATIVE
RBC, UA: NEGATIVE
Specific Gravity, UA: 1.02 (ref 1.005–1.030)
Urobilinogen, Ur: 0.2 mg/dL (ref 0.2–1.0)
pH, UA: 6 (ref 5.0–7.5)

## 2020-08-19 LAB — MICROSCOPIC EXAMINATION: Bacteria, UA: NONE SEEN

## 2020-08-19 NOTE — Patient Instructions (Signed)
Will call with results

## 2020-08-25 ENCOUNTER — Encounter: Payer: Self-pay | Admitting: Gastroenterology

## 2020-08-26 ENCOUNTER — Ambulatory Visit
Admission: RE | Admit: 2020-08-26 | Discharge: 2020-08-26 | Disposition: A | Discharge status: Home / Self Care | Payer: Managed Care, Other (non HMO) | Attending: Gastroenterology | Admitting: Gastroenterology

## 2020-08-26 ENCOUNTER — Encounter
Admission: RE | Disposition: A | Discharge status: Home / Self Care | Payer: Self-pay | Source: Home / Self Care | Attending: Gastroenterology

## 2020-08-26 ENCOUNTER — Ambulatory Visit: Payer: Managed Care, Other (non HMO) | Admitting: Registered Nurse

## 2020-08-26 ENCOUNTER — Encounter: Payer: Self-pay | Admitting: Gastroenterology

## 2020-08-26 DIAGNOSIS — Z8371 Family history of colonic polyps: Secondary | ICD-10-CM | POA: Diagnosis not present

## 2020-08-26 DIAGNOSIS — Z1211 Encounter for screening for malignant neoplasm of colon: Secondary | ICD-10-CM

## 2020-08-26 DIAGNOSIS — K635 Polyp of colon: Secondary | ICD-10-CM | POA: Diagnosis not present

## 2020-08-26 DIAGNOSIS — Z791 Long term (current) use of non-steroidal anti-inflammatories (NSAID): Secondary | ICD-10-CM | POA: Insufficient documentation

## 2020-08-26 DIAGNOSIS — D122 Benign neoplasm of ascending colon: Secondary | ICD-10-CM | POA: Diagnosis not present

## 2020-08-26 DIAGNOSIS — K5730 Diverticulosis of large intestine without perforation or abscess without bleeding: Secondary | ICD-10-CM | POA: Diagnosis not present

## 2020-08-26 DIAGNOSIS — Z91018 Allergy to other foods: Secondary | ICD-10-CM | POA: Insufficient documentation

## 2020-08-26 DIAGNOSIS — Z79899 Other long term (current) drug therapy: Secondary | ICD-10-CM | POA: Insufficient documentation

## 2020-08-26 HISTORY — PX: COLONOSCOPY WITH PROPOFOL: SHX5780

## 2020-08-26 HISTORY — DX: Personal history of urinary calculi: Z87.442

## 2020-08-26 SURGERY — COLONOSCOPY WITH PROPOFOL
Anesthesia: General

## 2020-08-26 MED ORDER — SODIUM CHLORIDE 0.9 % IV SOLN: INTRAVENOUS | Status: DC

## 2020-08-26 MED ORDER — PROPOFOL 10 MG/ML IV BOLUS
INTRAVENOUS | Status: DC | PRN
  Administered 2020-08-26: 10 mg via INTRAVENOUS
  Administered 2020-08-26: 30 mg via INTRAVENOUS
  Administered 2020-08-26: 10 mg via INTRAVENOUS
  Administered 2020-08-26: 20 mg via INTRAVENOUS
  Administered 2020-08-26: 90 mg via INTRAVENOUS

## 2020-08-26 MED ORDER — PROPOFOL 500 MG/50ML IV EMUL
INTRAVENOUS | Status: DC | PRN
  Administered 2020-08-26: 150 ug/kg/min via INTRAVENOUS

## 2020-08-26 MED ORDER — LIDOCAINE HCL (CARDIAC) PF 100 MG/5ML IV SOSY
PREFILLED_SYRINGE | INTRAVENOUS | Status: DC | PRN
  Administered 2020-08-26: 100 mg via INTRAVENOUS

## 2020-08-26 NOTE — Anesthesia Preprocedure Evaluation (Signed)
Anesthesia Evaluation  Patient identified by MRN, date of birth, ID band Patient awake    Reviewed: Allergy & Precautions, NPO status , Patient's Chart, lab work & pertinent test results  Airway Mallampati: II  TM Distance: >3 FB Neck ROM: Full    Dental  (+) Teeth Intact, Dental Advisory Given   Pulmonary    breath sounds clear to auscultation       Cardiovascular  Rhythm:Regular Rate:Normal     Neuro/Psych  Neuromuscular disease    GI/Hepatic GERD  Medicated,  Endo/Other    Renal/GU Renal disease     Musculoskeletal   Abdominal   Peds  Hematology   Anesthesia Other Findings   Reproductive/Obstetrics                             Anesthesia Physical  Anesthesia Plan  ASA: I  Anesthesia Plan: General   Post-op Pain Management:    Induction: Intravenous  PONV Risk Score and Plan: 2 and Propofol infusion and TIVA  Airway Management Planned: Nasal Cannula and Natural Airway  Additional Equipment:   Intra-op Plan:   Post-operative Plan: Extubation in OR  Informed Consent: I have reviewed the patients History and Physical, chart, labs and discussed the procedure including the risks, benefits and alternatives for the proposed anesthesia with the patient or authorized representative who has indicated his/her understanding and acceptance.     Dental advisory given  Plan Discussed with: CRNA and Anesthesiologist  Anesthesia Plan Comments:         Anesthesia Quick Evaluation

## 2020-08-26 NOTE — Transfer of Care (Signed)
Immediate Anesthesia Transfer of Care Note  Patient: Reginald [name]  Procedure(s) Performed: COLONOSCOPY WITH PROPOFOL (N/A )  Patient Location: Endoscopy Unit  Anesthesia Type:General  Level of Consciousness: drowsy  Airway & Oxygen Therapy: Patient Spontanous Breathing  Post-op Assessment: Report given to RN and Post -op Vital signs reviewed and stable  Post vital signs: Reviewed and stable  Last Vitals: see Epic record Vitals Value Taken Time  BP    Temp    Pulse    Resp    SpO2      Last Pain:  Vitals:   08/26/20 1226  TempSrc: Temporal  PainSc: 0-No pain         Complications: No complications documented.

## 2020-08-26 NOTE — Anesthesia Postprocedure Evaluation (Signed)
Anesthesia Post Note  Patient: Reginald [name]  Procedure(s) Performed: COLONOSCOPY WITH PROPOFOL (N/A )  Patient location during evaluation: Phase II Anesthesia Type: General Level of consciousness: awake and alert, awake and oriented Pain management: pain level controlled Vital Signs Assessment: post-procedure vital signs reviewed and stable Respiratory status: spontaneous breathing, nonlabored ventilation and respiratory function stable Cardiovascular status: blood pressure returned to baseline and stable Postop Assessment: no apparent nausea or vomiting Anesthetic complications: no   No complications documented.   Last Vitals:  Vitals:   08/26/20 1340 08/26/20 1350  BP: 106/79 121/88  Pulse: 65 72  Resp: 14 14  Temp:    SpO2: 95% 98%    Last Pain:  Vitals:   08/26/20 1350  TempSrc:   PainSc: 0-No pain                 Phill Mutter

## 2020-08-26 NOTE — H&P (Signed)
Jonathon Bellows, MD [address] [place], [place], [place], [address] [address], [place], [place], [place], [address] Phone: [phone]  Fax: [phone]  Primary Care Physician:  Charlynne Cousins, MD   Pre-Procedure History & Physical: HPI:  Reginald [name] is a 50 y.o. male is here for an colonoscopy.   Past Medical History:  Diagnosis Date  . Allergy   . GERD (gastroesophageal reflux disease)   . History of kidney stones   . Traumatic complete tear of right rotator cuff 10/18/2017    Past Surgical History:  Procedure Laterality Date  . LIPOMA EXCISION Left    side  . SHOULDER ARTHROSCOPY WITH BICEPS TENDON REPAIR Right 11/14/2017   Procedure: SHOULDER ARTHROSCOPY WITH BICEPS TENDON REPAIR;  Surgeon: Elsie Saas, MD;  Location: [place];  Service: Orthopedics;  Laterality: Right;  . SHOULDER ARTHROSCOPY WITH ROTATOR CUFF REPAIR AND SUBACROMIAL DECOMPRESSION Right 11/14/2017   Procedure: SHOULDER ARTHROSCOPY WITH ROTATOR CUFF REPAIR AND SUBACROMIAL DECOMPRESSION AND LABRAL DEBRIDMENT;  Surgeon: Elsie Saas, MD;  Location: [place];  Service: Orthopedics;  Laterality: Right;    Prior to Admission medications   Medication Sig Start Date End Date Taking? Authorizing Provider  Dexlansoprazole (DEXILANT) 30 MG capsule Take 1 capsule (30 mg total) by mouth daily. 08/12/20  Yes Vigg, Avanti, MD  doxycycline (VIBRA-TABS) 100 MG tablet Take 1 tablet (100 mg total) by mouth 2 (two) times daily for 14 days. 08/13/20 08/27/20 Yes Vigg, Avanti, MD  fexofenadine (ALLEGRA ALLERGY) 180 MG tablet Take 1 tablet (180 mg total) by mouth daily. 08/12/20  Yes Vigg, Avanti, MD  fluticasone (FLONASE) 50 MCG/ACT nasal spray Place 2 sprays into both nostrils daily. 08/12/20  Yes Vigg, Avanti, MD  meloxicam (MOBIC) 15 MG tablet Take 1 tablet (15 mg total) by mouth daily. 09/04/18   Edrick Kins, DPM  omeprazole (PRILOSEC) 40 MG capsule Take 1 capsule (40 mg total) by  mouth daily. 08/18/20 12/16/20  Jonathon Bellows, MD    Allergies as of 08/19/2020 - Review Complete 08/19/2020  Allergen Reaction Noted  . Yeast-related products Hives 08/12/2020    Family History  Problem Relation Age of Onset  . Arthritis Mother   . Hyperlipidemia Mother   . Hypertension Father     Social History   Socioeconomic History  . Marital status: Divorced    Spouse name: Not on file  . Number of children: Not on file  . Years of education: Not on file  . Highest education level: Not on file  Occupational History  . Not on file  Tobacco Use  . Smoking status: Never Smoker  . Smokeless tobacco: Never Used  Vaping Use  . Vaping Use: Never used  Substance and Sexual Activity  . Alcohol use: Not Currently  . Drug use: Never  . Sexual activity: Yes  Other Topics Concern  . Not on file  Social History Narrative  . Not on file   Social Determinants of Health   Financial Resource Strain: Not on file  Food Insecurity: Not on file  Transportation Needs: Not on file  Physical Activity: Not on file  Stress: Not on file  Social Connections: Not on file  Intimate Partner Violence: Not on file    Review of Systems: See HPI, otherwise negative ROS  Physical Exam: BP (!) 119/94   Pulse 83   Temp (!) 97.3 F (36.3 C) (Temporal)   Resp 16   Ht 5\' 9"  (1.753 m)  Wt 86.2 kg   SpO2 97%   BMI 28.06 kg/m  General:   Alert,  pleasant and cooperative in NAD Head:  Normocephalic and atraumatic. Neck:  Supple; no masses or thyromegaly. Lungs:  Clear throughout to auscultation, normal respiratory effort.    Heart:  +S1, +S2, Regular rate and rhythm, No edema. Abdomen:  Soft, nontender and nondistended. Normal bowel sounds, without guarding, and without rebound.   Neurologic:  Alert and  oriented x4;  grossly normal neurologically.  Impression/Plan: Reginald [name] is here for an colonoscopy to be performed for Screening colonoscopy , family history of colon polyps Risks,  benefits, limitations, and alternatives regarding  colonoscopy have been reviewed with the patient.  Questions have been answered.  All parties agreeable.   Jonathon Bellows, MD  08/26/2020, 12:56 PM

## 2020-08-26 NOTE — Op Note (Signed)
[facility] Patient Name: Reginald [name] Procedure Date: 08/26/2020 12:55 PM MRN: [id number] Account #: [id number] Date of Birth: 05-28-1970 Admit Type: Outpatient Age: 50 Room: [facility] ENDO ROOM 3 Gender: Male Note Status: Finalized Procedure:             Colonoscopy Indications:           Colon cancer screening in patient at increased risk:                         Family history of 1st-degree relative with colon polyps Providers:             Jonathon Bellows MD, MD Referring MD:          Charlynne Cousins (Referring MD) Medicines:             Monitored Anesthesia Care Complications:         No immediate complications. Procedure:             Pre-Anesthesia Assessment:                        - Prior to the procedure, a History and Physical was                         performed, and patient medications, allergies and                         sensitivities were reviewed. The patient's tolerance                         of previous anesthesia was reviewed.                        - The risks and benefits of the procedure and the                         sedation options and risks were discussed with the                         patient. All questions were answered and informed                         consent was obtained.                        - ASA Grade Assessment: II - A patient with mild                         systemic disease.                        After obtaining informed consent, the colonoscope was                         passed under direct vision. Throughout the procedure,                         the patient's blood pressure, pulse, and oxygen                         saturations were  monitored continuously. The                         Colonoscope was introduced through the anus and                         advanced to the the cecum, identified by the                         appendiceal orifice. The colonoscopy was performed                         with ease.  The patient tolerated the procedure well.                         The quality of the bowel preparation was excellent.                         The quality of the bowel preparation was excellent. Findings:      The perianal and digital rectal examinations were normal.      A 3 mm polyp was found in the proximal ascending colon. The polyp was       sessile. The polyp was removed with a cold biopsy forceps. Resection and       retrieval were complete.      Multiple small-mouthed diverticula were found in the right colon.      The exam was otherwise without abnormality on direct and retroflexion       views. Impression:            - One 3 mm polyp in the proximal ascending colon,                         removed with a cold biopsy forceps. Resected and                         retrieved.                        - Diverticulosis in the right colon.                        - The examination was otherwise normal on direct and                         retroflexion views. Recommendation:        - Discharge patient to home (with escort).                        - Resume previous diet.                        - Continue present medications.                        - Await pathology results.                        - Repeat colonoscopy for surveillance based on  pathology results. Procedure Code(s):     --- Professional ---                        [id number], Colonoscopy, flexible; with biopsy, single or                         multiple Diagnosis Code(s):     --- Professional ---                        Z83.71, Family history of colonic polyps                        K63.5, Polyp of colon                        K57.30, Diverticulosis of large intestine without                         perforation or abscess without bleeding CPT copyright 2019 American Medical Association. All rights reserved. The codes documented in this report are preliminary and upon coder review [place]  be revised to meet  current compliance requirements. Jonathon Bellows, MD Jonathon Bellows MD, MD 08/26/2020 1:21:32 PM This report has been signed electronically. Number of Addenda: 0 Note Initiated On: 08/26/2020 12:55 PM Scope Withdrawal Time: 0 hours 14 minutes 51 seconds  Total Procedure Duration: 0 hours 17 minutes 46 seconds  Estimated Blood Loss:  Estimated blood loss: none.      [facility]

## 2020-08-27 ENCOUNTER — Encounter: Payer: Self-pay | Admitting: Gastroenterology

## 2020-08-27 LAB — SURGICAL PATHOLOGY

## 2020-09-01 ENCOUNTER — Encounter: Payer: Self-pay | Admitting: Internal Medicine

## 2020-09-01 ENCOUNTER — Ambulatory Visit: Payer: Managed Care, Other (non HMO) | Admitting: Internal Medicine

## 2020-09-01 ENCOUNTER — Other Ambulatory Visit: Payer: Self-pay

## 2020-09-01 VITALS — BP 118/83 | HR 99 | Temp 98.6°F | Ht 69.02 in | Wt 190.6 lb

## 2020-09-01 DIAGNOSIS — Z131 Encounter for screening for diabetes mellitus: Secondary | ICD-10-CM

## 2020-09-01 DIAGNOSIS — R7989 Other specified abnormal findings of blood chemistry: Secondary | ICD-10-CM

## 2020-09-01 DIAGNOSIS — K219 Gastro-esophageal reflux disease without esophagitis: Secondary | ICD-10-CM | POA: Diagnosis not present

## 2020-09-01 DIAGNOSIS — Z136 Encounter for screening for cardiovascular disorders: Secondary | ICD-10-CM | POA: Diagnosis not present

## 2020-09-01 DIAGNOSIS — N400 Enlarged prostate without lower urinary tract symptoms: Secondary | ICD-10-CM | POA: Diagnosis not present

## 2020-09-01 NOTE — Progress Notes (Signed)
BP 118/83   Pulse 99   Temp 98.6 F (37 C) (Oral)   Ht 5' 9.02" (1.753 m)   Wt 190 lb 9.6 oz (86.5 kg)   SpO2 99%   BMI 28.13 kg/m    Subjective:    Patient ID: Reginald [name], male    DOB: March 14, 1971, 50 y.o.   MRN: [id number]  HPI: Reginald [name] is a 50 y.o. male  RMSF +Ve - S/p doxycycline x 2 days of this left.  Feels ok rash better, no arthralgias/ joint swelling, no fever or chills, night sweat.   Chief Complaint  Patient presents with  . Gastroesophageal Reflux  . Benign Prostatic Hypertrophy  . Rectal Bleeding    Had colonoscopy on 08/26/20. Patient states that 1 polyp was found and removed  . Rash    Still present on buttock  . Dowell Spotted Fever    Relevant past medical, surgical, family and social history reviewed and updated as indicated. Interim medical history since our last visit reviewed. Allergies and medications reviewed and updated.  Review of Systems  Constitutional: Negative for activity change, appetite change, chills, fatigue and fever.  HENT: Negative for congestion, ear discharge, ear pain and facial swelling.   Eyes: Negative for pain, discharge and itching.  Respiratory: Negative for cough, chest tightness, shortness of breath and wheezing.   Cardiovascular: Negative for chest pain, palpitations and leg swelling.  Gastrointestinal: Negative for abdominal distention, abdominal pain, blood in stool, constipation, diarrhea, nausea and vomiting.  Endocrine: Negative for cold intolerance, heat intolerance, polydipsia, polyphagia and polyuria.  Genitourinary: Negative for difficulty urinating, dysuria, flank pain, frequency, hematuria and urgency.  Musculoskeletal: Negative for arthralgias, gait problem, joint swelling and myalgias.  Skin: Negative for color change, rash and wound.  Neurological: Negative for dizziness, tremors, speech difficulty, weakness, light-headedness, numbness and headaches.  Hematological: Does not bruise/bleed  easily.  Psychiatric/Behavioral: Negative for agitation, confusion, decreased concentration, sleep disturbance and suicidal ideas.    Per HPI unless specifically indicated above     Objective:    BP 118/83   Pulse 99   Temp 98.6 F (37 C) (Oral)   Ht 5' 9.02" (1.753 m)   Wt 190 lb 9.6 oz (86.5 kg)   SpO2 99%   BMI 28.13 kg/m   Wt Readings from Last 3 Encounters:  09/01/20 190 lb 9.6 oz (86.5 kg)  08/26/20 190 lb (86.2 kg)  08/19/20 193 lb (87.5 kg)    Physical Exam Vitals and nursing note reviewed.  Constitutional:      General: He is not in acute distress.    Appearance: Normal appearance. He is not ill-appearing or diaphoretic.  HENT:     Head: Normocephalic and atraumatic.     Right Ear: Tympanic membrane and external ear normal. There is no impacted cerumen.     Left Ear: External ear normal.     Nose: No congestion or rhinorrhea.     Mouth/Throat:     Pharynx: No oropharyngeal exudate or posterior oropharyngeal erythema.  Eyes:     Conjunctiva/sclera: Conjunctivae normal.     Pupils: Pupils are equal, round, and reactive to light.  Cardiovascular:     Rate and Rhythm: Normal rate and regular rhythm.     Heart sounds: No murmur heard. No friction rub. No gallop.   Pulmonary:     Effort: No respiratory distress.     Breath sounds: No stridor. No wheezing or rhonchi.  Chest:     Chest  wall: No tenderness.  Abdominal:     General: Abdomen is flat. Bowel sounds are normal. There is no distension.     Palpations: Abdomen is soft. There is no mass.     Tenderness: There is no abdominal tenderness. There is no guarding.  Musculoskeletal:        General: No swelling or deformity.     Cervical back: Normal range of motion and neck supple. No rigidity or tenderness.     Right lower leg: No edema.     Left lower leg: No edema.  Skin:    General: Skin is warm and dry.     Coloration: Skin is not jaundiced.     Findings: No erythema.  Neurological:     Mental  Status: He is alert and oriented to person, place, and time. Mental status is at baseline.  Psychiatric:        Mood and Affect: Mood normal.        Behavior: Behavior normal.        Thought Content: Thought content normal.        Judgment: Judgment normal.     Results for orders placed or performed during the hospital encounter of 08/26/20  Surgical pathology  Result Value Ref Range   SURGICAL PATHOLOGY      SURGICAL PATHOLOGY CASE: [phone] [phone] PATIENT: Reginald [name] Surgical Pathology Report     Specimen Submitted: A. Colon polyp, ascending; cbx  Clinical History: Colon cancer screening Z12.11.  Colon polyp, diverticulosis      DIAGNOSIS: A.  COLON POLYP, ASCENDING; COLD BIOPSY: - TUBULAR ADENOMA. - NEGATIVE FOR HIGH-GRADE DYSPLASIA AND MALIGNANCY.  GROSS DESCRIPTION: A. Labeled: cbx polyp ascending colon Received: Formalin Collection time: 1:05 PM on 08/26/2020 Placed into formalin time: 1:05 PM on 08/26/2020 Tissue fragment(s): 1 Size: 0.4 x 0.3 x 0.2 cm Description: Tan soft tissue fragments Entirely submitted in 1 cassette.  RB 08/26/2020  Final Diagnosis performed by Quay Burow, MD.   Electronically signed 08/27/2020 10:37:31AM The electronic signature indicates that the named Attending Pathologist has evaluated the specimen Technical component performed at [facility], [address] [place], [place] [address] Lab: [phone] Dir: Rush Farmer, MD, M MM  Professional component performed at [facility], [facility], [place], [place], [place] [address] Lab: [phone] Dir: Dellia Nims. Reuel Derby, MD         Current Outpatient Medications:  .  Dexlansoprazole (DEXILANT) 30 MG capsule, Take 1 capsule (30 mg total) by mouth daily., Disp: 30 capsule, Rfl: 6 .  fexofenadine (ALLEGRA ALLERGY) 180 MG tablet, Take 1 tablet (180 mg total) by mouth daily., Disp: 30 tablet, Rfl: 2 .  fluticasone (FLONASE) 50 MCG/ACT nasal spray, Place 2 sprays  into both nostrils daily., Disp: 16 g, Rfl: 6 .  meloxicam (MOBIC) 15 MG tablet, Take 1 tablet (15 mg total) by mouth daily., Disp: 30 tablet, Rfl: 2 .  omeprazole (PRILOSEC) 40 MG capsule, Take 1 capsule (40 mg total) by mouth daily., Disp: 120 capsule, Rfl: 0    Assessment & Plan:  1. Rash : and tick bite + ve RMSF  S/p doxycycline x 2 days of this left.  Feels ok   2. elevated TSH  .check FT4 , Ft3  3.mildly elevated creatnine  Will recheck needs to rehydrate.  4. Anal itching better per pt using vaseline seen by GI for such   Problem List Items Addressed This Visit   None   Visit Diagnoses    Elevated TSH    -  Primary   Relevant Orders   T4, free   TSH       Follow up plan: No follow-ups on file.   obtianed results of repeat TSH/ Ft4  All wnl No tx indicated at this time Results for Reginald [name], Reginald [name] (MRN [id number]) as of 09/04/2020 09:26  Ref. Range 09/01/2020 16:10  TSH Latest Ref Range: 0.450 - 4.500 uIU/mL 3.840  T4,Free(Direct) Latest Ref Range: 0.82 - 1.77 ng/dL 1.04

## 2020-09-01 NOTE — Patient Instructions (Signed)
Reginald [name] Reginald [name] is a bacterial infection that spreads to people through contact with certain [name] The illness causes flu-like symptoms and a reddish-purple rash. The illness does not spread from person to person (is not contagious). When this condition is not treated right away, it can quickly become very serious, and can sometimes lead to long-term (chronic) health problems or even death. What are the causes? This condition is caused by a type of bacteria (Rickettsia rickettsii) that is carried by Reginald [name], Reginald [name], and Reginald [name] The infection spreads through:  A bite from an infected tick. Tick bites are usually painless, and they frequently are not noticed.  Infected tick blood, body fluids, or feces that get into the body through damaged skin, such as a small cut or sore. This could happen while removing a tick from a pet or from another person. What increases the risk? The following factors Reginald [name] make you more likely to develop this condition:  Spending a lot of Reginald outdoors, especially in rural areas or areas with long grass.  Spending Reginald outdoors during Reginald weather. [name] are most active during Reginald weather. What are the signs or symptoms? Symptoms of this condition include:  [name]  Muscle aches.  Headache.  Nausea.  Vomiting.  Poor appetite.  Abdominal pain.  A reddish-purple rash. ? This usually appears 2-5 days after the first symptoms begin. ? The rash often starts on the wrists, forearms, and ankles. It Reginald [name] then spread to the palms, the bottom of the feet, legs, and trunk. Symptoms Reginald [name] develop 2-14 days after a tick bite.   How is this diagnosed? This condition is diagnosed based on:  Your medical history.  A physical exam.  Blood tests.  Whether you have recently been bitten by a tick or spent Reginald in areas where: ? [name] are common. ? Reginald [name] is  common. How is this treated? This condition is treated with antibiotic medicines. It is important to begin treatment right away. In some cases, your health care provider Reginald [name] begin treatment before the diagnosis is confirmed. If your symptoms are severe, you Reginald [name] need to be treated in the hospital where you can get antibiotics and be monitored during treatment. Follow these instructions at home:  Take over-the-counter and prescription medicines only as told by your health care provider.  Take your antibiotic medicine as told by your health care provider. Do not stop taking the antibiotic even if you start to feel better.  Rest as much as possible until you feel better. Return to your normal activities as told by your health care provider.  Drink enough fluid to keep your urine pale yellow.  Keep all follow-up visits as told by your health care provider. This is important. Contact a health care provider if:  You have a rash that gets increasingly red or swollen.  You have fluid draining from any areas of your rash. Get help right away if:  You develop a [name].  You develop a rash 2-5 days after experiencing flu-like symptoms.  You have chest pain.  You have shortness of breath.  You have a severe headache.  You have jerky movements you cannot control (seizure).  You have severe abdominal pain.  You feel confused.  You bruise easily.  You have bleeding from your gums.  You have blood in your stool (feces).  You have trouble controlling when you urinate or  have bowel movements (incontinence).  You have vision problems.  You have numbness or tingling in your arms or legs. Summary  [facility] spotted [name] is a bacterial infection that spreads to people through contact with certain [name]  When this condition is not treated right away, it can quickly become very serious, and can sometimes lead to long-term (chronic) health problems or even  death.  You are more likely to develop this infection if you spend Reginald outdoors in Reginald weather and in areas with tall grass.  Symptoms of this condition include [name], headache, nausea, vomiting, abdominal pain, muscle aches, and a reddish-purple rash that usually appears 2-5 days after a [name]  This condition is treated with antibiotic medicines. This information is not intended to replace advice given to you by your health care provider. Make sure you discuss any questions you have with your health care provider. Document Revised: 04/07/2017 Document Reviewed: 06/30/2016 Elsevier Patient Education  [place].

## 2020-09-02 LAB — TSH: TSH: 3.84 u[IU]/mL (ref 0.450–4.500)

## 2020-09-02 LAB — T4, FREE: Free T4: 1.04 ng/dL (ref 0.82–1.77)

## 2020-10-13 ENCOUNTER — Ambulatory Visit: Payer: Managed Care, Other (non HMO) | Admitting: Gastroenterology

## 2020-10-13 ENCOUNTER — Other Ambulatory Visit: Payer: Self-pay

## 2020-10-13 ENCOUNTER — Encounter: Payer: Self-pay | Admitting: Gastroenterology

## 2020-10-13 VITALS — BP 142/95 | HR 73 | Temp 97.9°F | Ht 69.0 in | Wt 190.4 lb

## 2020-10-13 DIAGNOSIS — K219 Gastro-esophageal reflux disease without esophagitis: Secondary | ICD-10-CM

## 2020-10-13 NOTE — Progress Notes (Signed)
    Jonathon Bellows MD, MRCP(U.K) [address]  [place]  [place], [place] [address]  Main: [phone]  Fax: [phone]   Primary Care Physician: Charlynne Cousins, MD  Primary Gastroenterologist:  Dr. Jonathon Bellows   Chief Complaint  Patient presents with   Gastroesophageal Reflux    HPI: Reginald [name] is a 50 y.o. male   Summary of history :  He was initially referred for GERD on 08/18/2020. Was trying Nexium as needed   Interval history   08/18/2020-10/13/2020   He has been taking Dexilant 30 mg a day and has had no reflux symptoms when he goes off the medications he gets symptomatic.  No other complaints.  Adhering to lifestyle changes Current Outpatient Medications  Medication Sig Dispense Refill   Dexlansoprazole (DEXILANT) 30 MG capsule Take 1 capsule (30 mg total) by mouth daily. 30 capsule 6   fexofenadine (ALLEGRA ALLERGY) 180 MG tablet Take 1 tablet (180 mg total) by mouth daily. 30 tablet 2   fluticasone (FLONASE) 50 MCG/ACT nasal spray Place 2 sprays into both nostrils daily. 16 g 6   meloxicam (MOBIC) 15 MG tablet Take 1 tablet (15 mg total) by mouth daily. 30 tablet 2   No current facility-administered medications for this visit.    Allergies as of 10/13/2020 - Review Complete 10/13/2020  Allergen Reaction Noted   Yeast-related products Hives 08/12/2020    ROS:  General: Negative for anorexia, weight loss, fever, chills, fatigue, weakness. ENT: Negative for hoarseness, difficulty swallowing , nasal congestion. CV: Negative for chest pain, angina, palpitations, dyspnea on exertion, peripheral edema.  Respiratory: Negative for dyspnea at rest, dyspnea on exertion, cough, sputum, wheezing.  GI: See history of present illness. GU:  Negative for dysuria, hematuria, urinary incontinence, urinary frequency, nocturnal urination.  Endo: Negative for unusual weight change.    Physical Examination:   BP (!) 142/95   Pulse 73   Temp 97.9 F (36.6 C) (Oral)   Ht 5'  9" (1.753 m)   Wt 190 lb 6.4 oz (86.4 kg)   BMI 28.12 kg/m   General: Well-nourished, well-developed in no acute distress.  Eyes: No icterus. Conjunctivae pink. Neuro: Alert and oriented x 3.  Grossly intact. Skin: Warm and dry, no jaundice.   Psych: Alert and cooperative, normal mood and affect.   Imaging Studies: No results found.  Assessment and Plan:   Reginald [name] is a 50 y.o. y/o male here to follow up for GERD.  Symptoms are well controlled on PPI discussed options to stop the PPI and try taking famotidine 40 mg at night daily.  If he is symptomatic despite that he can go to Prilosec 20 mg a day.  Aim is to use the smaller dose of medications for the shortest period of time.  In addition discussed about adhering to lifestyle changes which he is doing.    Dr Jonathon Bellows  MD,MRCP [facility]) Follow up in as needed

## 2020-11-16 ENCOUNTER — Other Ambulatory Visit: Payer: Self-pay

## 2020-11-16 ENCOUNTER — Ambulatory Visit (INDEPENDENT_AMBULATORY_CARE_PROVIDER_SITE_OTHER): Payer: Managed Care, Other (non HMO) | Admitting: Internal Medicine

## 2020-11-16 ENCOUNTER — Encounter: Payer: Self-pay | Admitting: Internal Medicine

## 2020-11-16 VITALS — BP 128/78 | HR 89 | Temp 98.9°F | Ht 68.9 in | Wt 192.0 lb

## 2020-11-16 DIAGNOSIS — R21 Rash and other nonspecific skin eruption: Secondary | ICD-10-CM

## 2020-11-16 MED ORDER — METHYLPREDNISOLONE 4 MG PO TBPK: ORAL_TABLET | ORAL | 0 refills | Status: DC

## 2020-11-16 NOTE — Progress Notes (Signed)
BP 128/78   Pulse 89   Temp 98.9 F (37.2 C) (Oral)   Ht 5' 8.9" (1.75 m)   Wt 192 lb (87.1 kg)   SpO2 94%   BMI 28.44 kg/m    Subjective:    Patient ID: Reginald [name], male    DOB: [birth year], 50 y.o.   MRN: [id number]  Chief Complaint  Patient presents with  . Rash    Patient states that rash started on Saturday evening, from his feet up to this mid Thigh. He also states that it is very itchy. Mowed yard on Friday.     HPI: Reginald [name] is a 50 y.o. male  New onset of rash on bil lower ext.   Rash This is a new (started saturday after he had mowed his lawn. pt has had RMSF in the past.) problem. The current episode started in the past 7 days. The affected locations include the right lower leg. The rash is characterized by redness and itchiness. He was exposed to plant contact. Pertinent negatives include no anorexia, congestion, cough, diarrhea, eye pain, facial edema, fatigue, fever, joint pain, nail changes, rhinorrhea, shortness of breath, sore throat or vomiting.   Chief Complaint  Patient presents with  . Rash    Patient states that rash started on Saturday evening, from his feet up to this mid Thigh. He also states that it is very itchy. Mowed yard on Friday.     Relevant past medical, surgical, family and social history reviewed and updated as indicated. Interim medical history since our last visit reviewed. Allergies and medications reviewed and updated.  Review of Systems  Constitutional:  Negative for fatigue and fever.  HENT:  Negative for congestion, rhinorrhea and sore throat.   Eyes:  Negative for pain.  Respiratory:  Negative for cough and shortness of breath.   Gastrointestinal:  Negative for anorexia, diarrhea and vomiting.  Musculoskeletal:  Negative for joint pain.  Skin:  Positive for rash. Negative for nail changes.   Per HPI unless specifically indicated above     Objective:    BP 128/78   Pulse 89   Temp 98.9 F (37.2 C) (Oral)   Ht 5'  8.9" (1.75 m)   Wt 192 lb (87.1 kg)   SpO2 94%   BMI 28.44 kg/m   Wt Readings from Last 3 Encounters:  11/16/20 192 lb (87.1 kg)  10/13/20 190 lb 6.4 oz (86.4 kg)  09/01/20 190 lb 9.6 oz (86.5 kg)    Physical Exam Constitutional:      Appearance: Normal appearance.  HENT:     Head: Normocephalic and atraumatic.  Skin:    Coloration: Skin is not jaundiced or pale.     Findings: Rash present. No bruising or lesion.     Comments: Rash noted on bil lower ext maculo papular with ?linear streaking .   Neurological:     Mental Status: He is alert.   Results for orders placed or performed in visit on 09/01/20  T4, free  Result Value Ref Range   Free T4 1.04 0.82 - 1.77 ng/dL  TSH  Result Value Ref Range   TSH 3.840 0.450 - 4.500 uIU/mL        Current Outpatient Medications:  .  Dexlansoprazole (DEXILANT) 30 MG capsule, Take 1 capsule (30 mg total) by mouth daily., Disp: 30 capsule, Rfl: 6 .  fexofenadine (ALLEGRA ALLERGY) 180 MG tablet, Take 1 tablet (180 mg total) by mouth daily., Disp: 30 tablet,  Rfl: 2 .  fluticasone (FLONASE) 50 MCG/ACT nasal spray, Place 2 sprays into both nostrils daily., Disp: 16 g, Rfl: 6 .  meloxicam (MOBIC) 15 MG tablet, Take 1 tablet (15 mg total) by mouth daily., Disp: 30 tablet, Rfl: 2    Assessment & Plan:  Rash on lower ext ? Sec to poison ivy  Will start pt on medrol dose pak  Use otc calamine lotion Use allegra fro itching.  S/p RMSF in [place] advised to use OFF / other insect repllents as he is out in the law/ mowing his grass.  Pt advised to use replellents such as - DEET which  is effective against mosquitoes, biting flies, chiggers, fleas, and ticks.  Problem List Items Addressed This Visit   None    No orders of the defined types were placed in this encounter.    Meds ordered this encounter  Medications  . methylPREDNISolone (MEDROL DOSEPAK) 4 MG TBPK tablet    Sig: Use as directed    Dispense:  1 each    Refill:  0     Follow  up plan: No follow-ups on file.  ras

## 2020-11-24 ENCOUNTER — Other Ambulatory Visit: Payer: Self-pay

## 2020-11-24 ENCOUNTER — Other Ambulatory Visit: Payer: Managed Care, Other (non HMO)

## 2020-11-24 DIAGNOSIS — K219 Gastro-esophageal reflux disease without esophagitis: Secondary | ICD-10-CM

## 2020-11-24 DIAGNOSIS — Z136 Encounter for screening for cardiovascular disorders: Secondary | ICD-10-CM

## 2020-11-24 DIAGNOSIS — Z131 Encounter for screening for diabetes mellitus: Secondary | ICD-10-CM

## 2020-11-24 DIAGNOSIS — R7989 Other specified abnormal findings of blood chemistry: Secondary | ICD-10-CM

## 2020-11-24 DIAGNOSIS — N400 Enlarged prostate without lower urinary tract symptoms: Secondary | ICD-10-CM

## 2020-11-24 LAB — BAYER DCA HB A1C WAIVED: HB A1C (BAYER DCA - WAIVED): 5.7 % (ref <7.0)

## 2020-11-25 ENCOUNTER — Other Ambulatory Visit: Payer: Self-pay | Admitting: Internal Medicine

## 2020-11-25 MED ORDER — FENOFIBRATE 145 MG PO TABS: 145.0000 mg | ORAL_TABLET | Freq: Every day | ORAL | 3 refills | Status: AC

## 2020-11-25 NOTE — Progress Notes (Signed)
Please let pt know to pick up fenofibrate. Tg high. Recheck FLP x 6 weeks pleae.. low fat and high fiber diet explained to pt.

## 2020-11-26 LAB — COMPREHENSIVE METABOLIC PANEL
ALT: 38 IU/L (ref 0–44)
AST: 20 IU/L (ref 0–40)
Albumin/Globulin Ratio: 1.7 (ref 1.2–2.2)
Albumin: 4.3 g/dL (ref 4.0–5.0)
Alkaline Phosphatase: 100 IU/L (ref 44–121)
BUN/Creatinine Ratio: 16 (ref 9–20)
BUN: 18 mg/dL (ref 6–24)
Bilirubin Total: 0.3 mg/dL (ref 0.0–1.2)
CO2: 20 mmol/L (ref 20–29)
Calcium: 9.3 mg/dL (ref 8.7–10.2)
Chloride: 103 mmol/L (ref 96–106)
Creatinine, Ser: 1.15 mg/dL (ref 0.76–1.27)
Globulin, Total: 2.5 g/dL (ref 1.5–4.5)
Glucose: 113 mg/dL — ABNORMAL HIGH (ref 65–99)
Potassium: 4.4 mmol/L (ref 3.5–5.2)
Sodium: 140 mmol/L (ref 134–144)
Total Protein: 6.8 g/dL (ref 6.0–8.5)
eGFR: 79 mL/min/{1.73_m2} (ref >59)

## 2020-11-26 LAB — THYROID PANEL WITH TSH
Free Thyroxine Index: 2.1 (ref 1.2–4.9)
T3 Uptake Ratio: 26 % (ref 24–39)
T4, Total: 8.1 ug/dL (ref 4.5–12.0)
TSH: 4.32 u[IU]/mL (ref 0.450–4.500)

## 2020-11-26 LAB — LIPID PANEL
Chol/HDL Ratio: 5.5 ratio — ABNORMAL HIGH (ref 0.0–5.0)
Cholesterol, Total: 183 mg/dL (ref 100–199)
HDL: 33 mg/dL — ABNORMAL LOW (ref >39)
LDL Chol Calc (NIH): 77 mg/dL (ref 0–99)
Triglycerides: 455 mg/dL — ABNORMAL HIGH (ref 0–149)
VLDL Cholesterol Cal: 73 mg/dL — ABNORMAL HIGH (ref 5–40)

## 2020-11-26 LAB — CBC WITH DIFFERENTIAL/PLATELET
Basophils Absolute: 0.1 10*3/uL (ref 0.0–0.2)
Basos: 1 %
EOS (ABSOLUTE): 0.4 10*3/uL (ref 0.0–0.4)
Eos: 4 %
Hematocrit: 46.4 % (ref 37.5–51.0)
Hemoglobin: 15.6 g/dL (ref 13.0–17.7)
Immature Grans (Abs): 0 10*3/uL (ref 0.0–0.1)
Immature Granulocytes: 1 %
Lymphocytes Absolute: 2.1 10*3/uL (ref 0.7–3.1)
Lymphs: 25 %
MCH: 27.8 pg (ref 26.6–33.0)
MCHC: 33.6 g/dL (ref 31.5–35.7)
MCV: 83 fL (ref 79–97)
Monocytes Absolute: 0.7 10*3/uL (ref 0.1–0.9)
Monocytes: 9 %
Neutrophils Absolute: 5 10*3/uL (ref 1.4–7.0)
Neutrophils: 60 %
Platelets: 214 10*3/uL (ref 150–450)
RBC: 5.61 x10E6/uL (ref 4.14–5.80)
RDW: 13 % (ref 11.6–15.4)
WBC: 8.3 10*3/uL (ref 3.4–10.8)

## 2020-11-26 LAB — PSA TOTAL+% FREE (SERIAL)
PSA, Free Pct: 34 %
PSA, Free: 0.17 ng/mL
Prostate Specific Ag, Serum: 0.5 ng/mL (ref 0.0–4.0)

## 2020-12-02 ENCOUNTER — Ambulatory Visit: Payer: Managed Care, Other (non HMO) | Admitting: Internal Medicine

## 2020-12-28 ENCOUNTER — Ambulatory Visit: Payer: Managed Care, Other (non HMO) | Admitting: Internal Medicine

## 2021-01-14 ENCOUNTER — Ambulatory Visit: Payer: Managed Care, Other (non HMO) | Admitting: Internal Medicine

## 2021-01-18 ENCOUNTER — Encounter: Payer: Self-pay | Admitting: Internal Medicine

## 2021-01-18 ENCOUNTER — Ambulatory Visit: Payer: Managed Care, Other (non HMO) | Admitting: Internal Medicine

## 2021-01-18 ENCOUNTER — Other Ambulatory Visit: Payer: Self-pay

## 2021-01-18 VITALS — BP 135/84 | HR 98 | Temp 98.5°F | Ht 68.9 in | Wt 190.2 lb

## 2021-01-18 DIAGNOSIS — E785 Hyperlipidemia, unspecified: Secondary | ICD-10-CM | POA: Diagnosis not present

## 2021-01-18 DIAGNOSIS — K219 Gastro-esophageal reflux disease without esophagitis: Secondary | ICD-10-CM

## 2021-01-18 DIAGNOSIS — L290 Pruritus ani: Secondary | ICD-10-CM | POA: Insufficient documentation

## 2021-01-18 NOTE — Progress Notes (Signed)
BP 135/84   Pulse 98   Temp 98.5 F (36.9 C) (Oral)   Ht 5' 8.9" (1.75 m)   Wt 190 lb 3.2 oz (86.3 kg)   SpO2 98%   BMI 28.17 kg/m    Subjective:    Patient ID: Reginald [name], male    DOB: Aug 02, 1970, 50 y.o.   MRN: [id number]  Chief Complaint  Patient presents with   Anal Itching    For the past 8 to 9 months  Has tried neosporin, benadryl, hydrocortisone, preparation H    HPI: Reginald [name] is a 50 y.o. male  Pt is here for a follow up. Has had some rectal bleeding and discharge.   Gastroesophageal Reflux He complains of heartburn. He reports no abdominal pain, no belching, no chest pain, no choking, no early satiety, no globus sensation, no hoarse voice, no nausea, no sore throat, no stridor, no tooth decay, no water brash or no wheezing. The problem has been waxing and waning.  Hyperlipidemia This is a chronic problem. The problem is controlled. Pertinent negatives include no chest pain, focal sensory loss, focal weakness, leg pain, myalgias or shortness of breath. The current treatment provides no improvement of lipids.   Chief Complaint  Patient presents with   Anal Itching    For the past 8 to 9 months  Has tried neosporin, benadryl, hydrocortisone, preparation H    Relevant past medical, surgical, family and social history reviewed and updated as indicated. Interim medical history since our last visit reviewed. Allergies and medications reviewed and updated.  Review of Systems  HENT:  Negative for hoarse voice and sore throat.   Respiratory:  Negative for choking, shortness of breath and wheezing.   Cardiovascular:  Negative for chest pain.  Gastrointestinal:  Positive for heartburn. Negative for abdominal pain and nausea.  Musculoskeletal:  Negative for myalgias.  Neurological:  Negative for focal weakness.   Per HPI unless specifically indicated above     Objective:    BP 135/84   Pulse 98   Temp 98.5 F (36.9 C) (Oral)   Ht 5' 8.9" (1.75 m)   Wt 190  lb 3.2 oz (86.3 kg)   SpO2 98%   BMI 28.17 kg/m   Wt Readings from Last 3 Encounters:  01/18/21 190 lb 3.2 oz (86.3 kg)  11/16/20 192 lb (87.1 kg)  10/13/20 190 lb 6.4 oz (86.4 kg)    Physical Exam  Results for orders placed or performed in visit on 11/24/20  Lipid panel  Result Value Ref Range   Cholesterol, Total 183 100 - 199 mg/dL   Triglycerides 455 (H) 0 - 149 mg/dL   HDL 33 (L) >39 mg/dL   VLDL Cholesterol Cal 73 (H) 5 - 40 mg/dL   LDL Chol Calc (NIH) 77 0 - 99 mg/dL   Chol/HDL Ratio 5.5 (H) 0.0 - 5.0 ratio  Bayer DCA Hb A1c Waived  Result Value Ref Range   HB A1C (BAYER DCA - WAIVED) 5.7 <7.0 %  Thyroid Panel With TSH  Result Value Ref Range   TSH 4.320 0.450 - 4.500 uIU/mL   T4, Total 8.1 4.5 - 12.0 ug/dL   T3 Uptake Ratio 26 24 - 39 %   Free Thyroxine Index 2.1 1.2 - 4.9  CBC with Differential/Platelet  Result Value Ref Range   WBC 8.3 3.4 - 10.8 x10E3/uL   RBC 5.61 4.14 - 5.80 x10E6/uL   Hemoglobin 15.6 13.0 - 17.7 g/dL   Hematocrit 46.4  37.5 - 51.0 %   MCV 83 79 - 97 fL   MCH 27.8 26.6 - 33.0 pg   MCHC 33.6 31.5 - 35.7 g/dL   RDW 67.8 11.8 - 37.4 %   Platelets 214 150 - 450 x10E3/uL   Neutrophils 60 Not Estab. %   Lymphs 25 Not Estab. %   Monocytes 9 Not Estab. %   Eos 4 Not Estab. %   Basos 1 Not Estab. %   Neutrophils Absolute 5.0 1.4 - 7.0 x10E3/uL   Lymphocytes Absolute 2.1 0.7 - 3.1 x10E3/uL   Monocytes Absolute 0.7 0.1 - 0.9 x10E3/uL   EOS (ABSOLUTE) 0.4 0.0 - 0.4 x10E3/uL   Basophils Absolute 0.1 0.0 - 0.2 x10E3/uL   Immature Granulocytes 1 Not Estab. %   Immature Grans (Abs) 0.0 0.0 - 0.1 x10E3/uL  Comprehensive metabolic panel  Result Value Ref Range   Glucose 113 (H) 65 - 99 mg/dL   BUN 18 6 - 24 mg/dL   Creatinine, Ser 0.02 0.76 - 1.27 mg/dL   eGFR 79 >14 AW/VNC/5.88   BUN/Creatinine Ratio 16 9 - 20   Sodium 140 134 - 144 mmol/L   Potassium 4.4 3.5 - 5.2 mmol/L   Chloride 103 96 - 106 mmol/L   CO2 20 20 - 29 mmol/L   Calcium 9.3  8.7 - 10.2 mg/dL   Total Protein 6.8 6.0 - 8.5 g/dL   Albumin 4.3 4.0 - 5.0 g/dL   Globulin, Total 2.5 1.5 - 4.5 g/dL   Albumin/Globulin Ratio 1.7 1.2 - 2.2   Bilirubin Total 0.3 0.0 - 1.2 mg/dL   Alkaline Phosphatase 100 44 - 121 IU/L   AST 20 0 - 40 IU/L   ALT 38 0 - 44 IU/L  PSA Total+%Free (Serial)  Result Value Ref Range   Prostate Specific Ag, Serum 0.5 0.0 - 4.0 ng/mL   PSA, Free 0.17 N/A ng/mL   PSA, Free Pct 34.0 %        Current Outpatient Medications:    Dexlansoprazole (DEXILANT) 30 MG capsule, Take 1 capsule (30 mg total) by mouth daily., Disp: 30 capsule, Rfl: 6   fenofibrate (TRICOR) 145 MG tablet, Take 1 tablet (145 mg total) by mouth daily., Disp: 30 tablet, Rfl: 3   fexofenadine (ALLEGRA ALLERGY) 180 MG tablet, Take 1 tablet (180 mg total) by mouth daily., Disp: 30 tablet, Rfl: 2   fluticasone (FLONASE) 50 MCG/ACT nasal spray, Place 2 sprays into both nostrils daily., Disp: 16 g, Rfl: 6   meloxicam (MOBIC) 15 MG tablet, Take 1 tablet (15 mg total) by mouth daily., Disp: 30 tablet, Rfl: 2   methylPREDNISolone (MEDROL DOSEPAK) 4 MG TBPK tablet, Use as directed, Disp: 1 each, Rfl: 0    Assessment & Plan:  Reginald [name] is on dexilant for such  patient advised to avoid laying down soon after his meals. He took a 2 hours between dinner and bedtime. Avoid spicy food and triggers that he knows food wise that worsen his acid reflux. Patient verbalized understanding of the above. Lifestyle modifications as above discussed with patient.   2. HLD is on fenofibrate recheck FLP, check LFT's work on diet, SE of meds explained to pt. low fat and high fiber diet explained to pt.   3. Rectal itching  - will check O and P  - stool culutres  Problem List Items Addressed This Visit   None    Orders Placed This Encounter  Procedures   Ova and parasite examination  Stool Culture     No orders of the defined types were placed in this encounter.    Follow up plan: No follow-ups  on file.

## 2021-01-26 NOTE — Progress Notes (Signed)
Please let pt know this was normal.

## 2021-01-27 LAB — STOOL CULTURE: E coli, Shiga toxin Assay: NEGATIVE

## 2021-01-29 LAB — OVA AND PARASITE EXAMINATION

## 2021-05-12 ENCOUNTER — Ambulatory Visit
Admission: RE | Admit: 2021-05-12 | Discharge: 2021-05-12 | Disposition: A | Discharge status: Home / Self Care | Payer: Managed Care, Other (non HMO) | Source: Ambulatory Visit | Attending: Nurse Practitioner | Admitting: Nurse Practitioner

## 2021-05-12 ENCOUNTER — Telehealth (INDEPENDENT_AMBULATORY_CARE_PROVIDER_SITE_OTHER): Payer: Managed Care, Other (non HMO) | Admitting: Nurse Practitioner

## 2021-05-12 ENCOUNTER — Encounter: Payer: Self-pay | Admitting: Nurse Practitioner

## 2021-05-12 ENCOUNTER — Ambulatory Visit: Payer: Managed Care, Other (non HMO) | Admitting: Nurse Practitioner

## 2021-05-12 DIAGNOSIS — U071 COVID-19: Secondary | ICD-10-CM | POA: Insufficient documentation

## 2021-05-12 MED ORDER — PREDNISONE 10 MG PO TABS: 10.0000 mg | ORAL_TABLET | Freq: Every day | ORAL | 0 refills | Status: DC

## 2021-05-12 MED ORDER — AZITHROMYCIN 250 MG PO TABS: ORAL_TABLET | ORAL | 0 refills | Status: AC

## 2021-05-12 NOTE — Progress Notes (Signed)
There were no vitals taken for this visit.   Subjective:    Patient ID: Reginald [name], male    DOB: 1971/01/21, 51 y.o.   MRN: [id number]  HPI: Reginald [name] is a 51 y.o. male  Chief Complaint  Patient presents with   URI    Pt states he was diagnosed with covid 04/13/21. Negative home test yesterday. States he has been having a cough, sore throat, congestion, fatigue, chills, and fever off and on since having covid.    UPPER RESPIRATORY TRACT INFECTION Worst symptom: diagnosed with COVID on 04/13/21. Has been having symptoms on and off since he was diagnosed with COVID. Fever: yes Cough: yes Shortness of breath: yes Wheezing: yes Chest pain: no Chest tightness: yes Chest congestion: yes Nasal congestion: yes Runny nose: yes Post nasal drip: yes Sneezing: yes Sore throat: yes Swollen glands: yes Sinus pressure: yes Headache: yes Face pain: no Toothache: yes Ear pain: no bilateral Ear pressure: yes bilateral Eyes red/itching:yes Eye drainage/crusting: yes  Vomiting: no Rash: no Fatigue: yes Sick contacts: no Strep contacts: no  Context: better was getting better until yesterday Recurrent sinusitis: no Relief with OTC cold/cough medications: yes  Treatments attempted:  cough drops and nyquil    Relevant past medical, surgical, family and social history reviewed and updated as indicated. Interim medical history since our last visit reviewed. Allergies and medications reviewed and updated.  Review of Systems  Constitutional:  Positive for fatigue and fever.  HENT:  Positive for congestion, postnasal drip, rhinorrhea, sinus pressure, sinus pain, sneezing and sore throat. Negative for ear pain.   Respiratory:  Positive for cough, chest tightness, shortness of breath and wheezing.   Gastrointestinal:  Negative for vomiting.  Skin:  Negative for rash.  Neurological:  Positive for headaches.   Per HPI unless specifically indicated above     Objective:    There  were no vitals taken for this visit.  Wt Readings from Last 3 Encounters:  01/18/21 190 lb 3.2 oz (86.3 kg)  11/16/20 192 lb (87.1 kg)  10/13/20 190 lb 6.4 oz (86.4 kg)    Physical Exam Vitals and nursing note reviewed.  Constitutional:      General: He is not in acute distress.    Appearance: He is not ill-appearing.  HENT:     Head: Normocephalic.     Right Ear: Hearing normal.     Left Ear: Hearing normal.     Nose: Nose normal.  Pulmonary:     Effort: Pulmonary effort is normal. No respiratory distress.  Neurological:     Mental Status: He is alert.  Psychiatric:        Mood and Affect: Mood normal.        Behavior: Behavior normal.        Thought Content: Thought content normal.        Judgment: Judgment normal.    Results for orders placed or performed in visit on 01/18/21  Ova and parasite examination   Specimen: Stool   ST  Result Value Ref Range   OVA + PARASITE EXAM Final report    O&P result 1 Comment   Stool Culture   Specimen: Stool   ST  Result Value Ref Range   Salmonella/Shigella Screen Final report    Stool Culture result 1 (RSASHR) Comment    Campylobacter Culture Final report    Stool Culture result 1 (CMPCXR) Comment    E coli, Shiga toxin Assay Negative Negative  Assessment & Plan:   Problem List Items Addressed This Visit   None Visit Diagnoses     COVID-19    -  Primary   Will obtain chest xray to r/o pneumonia. Will treat with prednisone and zpak due to ongoing symptoms. Will make further recommendations based on imaging results   Relevant Medications   azithromycin (ZITHROMAX) 250 MG tablet   Other Relevant Orders   DG Chest 2 View        Follow up plan: Return if symptoms worsen or fail to improve.  This visit was completed via MyChart due to the restrictions of the COVID-19 pandemic. All issues as above were discussed and addressed. Physical exam was done as above through visual confirmation on MyChart. If it was felt that  the patient should be evaluated in the office, they were directed there. The patient verbally consented to this visit. Location of the patient: Home Location of the provider: Office Those involved with this call:  Provider: Jon Billings, NP CMA: Yvonna Alanis, [place] Desk/Registration: Myrlene Broker This encounter was conducted via video.  I spent 15 dedicated to the care of this patient on the date of this encounter to include previsit review of 20, face to face time with the patient, and post visit ordering of testing.

## 2021-05-13 NOTE — Progress Notes (Signed)
Please let patient know that his chest xray showed low lung volumes.  Which is a decrease in the air in the lungs. The steroids given yesterday should help this.  Please let me know if he has any questions.

## 2021-07-22 ENCOUNTER — Other Ambulatory Visit: Payer: Self-pay | Admitting: Gastroenterology

## 2021-08-06 ENCOUNTER — Other Ambulatory Visit: Payer: Self-pay | Admitting: Gastroenterology

## 2021-08-10 ENCOUNTER — Other Ambulatory Visit: Payer: Self-pay | Admitting: Gastroenterology

## 2021-10-05 ENCOUNTER — Ambulatory Visit: Payer: Self-pay | Admitting: *Deleted

## 2021-10-05 NOTE — Telephone Encounter (Signed)
  Chief Complaint: Tick bite Symptoms: Removed tick 2 weeks ago, last week and again this weekend.Small ticks with white spot on back. States embedded for 30 minutes to an hour, each one. Pt diagnosed with [facility] Spotted Fever 08/12/20. States has had rash on wrist since then, "Flare's up whenever I get another tick bite." Presently from right wrist to elbow, itchy. Pt reports ongoing fatigue.  Frequency: Weekend Pertinent Negatives: Patient denies fever Disposition: '[]'$ ED /'[]'$ Urgent Care (no appt availability in office) / '[x]'$ Appointment(In office/virtual)/ '[]'$  [place] [facility]/ '[]'$ Home Care/ '[]'$ Refused Recommended Disposition /'[]'$ [place] [facility]/ '[]'$  Follow-up with PCP Additional Notes: Secured appt for tomorrow with Dr. Neomia Dear. Called during practices lunch break. Please advise if able to be seen today. Thank you. Reason for Disposition  [1] Red or very tender (to touch) area AND [2] started over 24 hours after the bite  Answer Assessment - Initial Assessment Questions 1. TYPE of TICK: "Is it a wood tick or a deer tick?" (e.g., deer tick, wood tick; unsure)     Smaller than dog tick, white spot on back 2. SIZE of TICK: "How big is the tick?" (e.g., size of poppy seed, apple seed, watermelon seed; unsure) Note: Deer ticks can be the size of a poppy seed (nymph) or an apple seed (adult).       Small 3. ENGORGED: "Did the tick look flat or engorged (full, swollen)?" (e.g., flat, engorged; unsure)     Unsure 4. LOCATION: "Where is the tick bite located?"      Inner right thigh 5. ONSET: "How long do you think the tick was attached before you removed it?" (e.g., 5 hours, 2 days)       30 minutes to an hour  6. APPEARANCE of BITE or RASH: "What does the site look like?"     Right wrist, "Flared up" wrist to elbow,red, raised, itchy, feeling fatigued, no fever  Protocols used: Tick Bite-A-AH

## 2021-10-05 NOTE — Telephone Encounter (Signed)
Routing to provider  

## 2021-10-06 ENCOUNTER — Encounter: Payer: Self-pay | Admitting: Internal Medicine

## 2021-10-06 ENCOUNTER — Ambulatory Visit: Payer: Managed Care, Other (non HMO) | Admitting: Internal Medicine

## 2021-10-06 VITALS — BP 132/86 | HR 69 | Temp 98.2°F | Ht 68.9 in | Wt 192.0 lb

## 2021-10-06 DIAGNOSIS — R21 Rash and other nonspecific skin eruption: Secondary | ICD-10-CM | POA: Diagnosis not present

## 2021-10-06 MED ORDER — TRIAMCINOLONE 0.1 % CREAM:EUCERIN CREAM 1:1: 1.0000 | TOPICAL_CREAM | Freq: Two times a day (BID) | CUTANEOUS | 1 refills | Status: DC

## 2021-10-06 MED ORDER — TRIAMCINOLONE ACETONIDE 0.1 % EX OINT: 1.0000 | TOPICAL_OINTMENT | Freq: Two times a day (BID) | CUTANEOUS | 0 refills | Status: AC

## 2021-10-06 MED ORDER — FEXOFENADINE HCL 180 MG PO TABS: 180.0000 mg | ORAL_TABLET | Freq: Every day | ORAL | 1 refills | Status: AC

## 2021-10-06 NOTE — Progress Notes (Signed)
BP 132/86   Pulse 69   Temp 98.2 F (36.8 C) (Oral)   Ht 5' 8.9" (1.75 m)   Wt 192 lb (87.1 kg)   SpO2 94%   BMI 28.44 kg/m    Subjective:    Patient ID: Reginald [name], male    DOB: 05/17/1970, 51 y.o.   MRN: [id number]  Chief Complaint  Patient presents with  . Rash    Patient states he has a rash on his right forearm, and that he has had some tick bite. First noticed 2 to 3 months ago    HPI: Reginald [name] is a 51 y.o. male  Rash on the right forearm has a papular rash on such. Has had multiple tick bite -   Rash This is a new problem. The current episode started in the past 7 days. The problem has been gradually improving since onset. Location: right forearm. The rash is characterized by itchiness and dryness.    Chief Complaint  Patient presents with  . Rash    Patient states he has a rash on his right forearm, and that he has had some tick bite. First noticed 2 to 3 months ago    Relevant past medical, surgical, family and social history reviewed and updated as indicated. Interim medical history since our last visit reviewed. Allergies and medications reviewed and updated.  Review of Systems  Skin:  Positive for rash.    Per HPI unless specifically indicated above     Objective:    BP 132/86   Pulse 69   Temp 98.2 F (36.8 C) (Oral)   Ht 5' 8.9" (1.75 m)   Wt 192 lb (87.1 kg)   SpO2 94%   BMI 28.44 kg/m   Wt Readings from Last 3 Encounters:  10/06/21 192 lb (87.1 kg)  01/18/21 190 lb 3.2 oz (86.3 kg)  11/16/20 192 lb (87.1 kg)    Physical Exam Constitutional:      Appearance: Normal appearance.  Skin:    Coloration: Skin is not jaundiced or pale.     Findings: Erythema and rash present. No bruising or lesion.  Neurological:     Mental Status: He is alert.    Results for orders placed or performed in visit on 01/18/21  Ova and parasite examination   Specimen: Stool   ST  Result Value Ref Range   OVA + PARASITE EXAM Final report    O&P  result 1 Comment   Stool Culture   Specimen: Stool   ST  Result Value Ref Range   Salmonella/Shigella Screen Final report    Stool Culture result 1 (RSASHR) Comment    Campylobacter Culture Final report    Stool Culture result 1 (CMPCXR) Comment    E coli, Shiga toxin Assay Negative Negative        Current Outpatient Medications:  .  Dexlansoprazole (DEXILANT) 30 MG capsule, Take 1 capsule (30 mg total) by mouth daily., Disp: 30 capsule, Rfl: 6 .  fexofenadine (ALLEGRA ALLERGY) 180 MG tablet, Take 1 tablet (180 mg total) by mouth daily., Disp: 30 tablet, Rfl: 2 .  fexofenadine (ALLEGRA ALLERGY) 180 MG tablet, Take 1 tablet (180 mg total) by mouth daily., Disp: 10 tablet, Rfl: 1 .  fluticasone (FLONASE) 50 MCG/ACT nasal spray, Place 2 sprays into both nostrils daily., Disp: 16 g, Rfl: 6 .  triamcinolone ointment (KENALOG) 0.1 %, Apply 1 Application topically 2 (two) times daily., Disp: 30 g, Rfl: 0 .  fenofibrate (  TRICOR) 145 MG tablet, Take 1 tablet (145 mg total) by mouth daily. (Patient not taking: Reported on 10/06/2021), Disp: 30 tablet, Rfl: 3    Assessment & Plan:  Contact dermatitis : ? 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 Will start pt on allegra and steroid topical creams for help with rash.   Problem List Items Addressed This Visit       Musculoskeletal and Integument   Rash - Primary   Relevant Orders   Ambulatory referral to Dermatology     Orders Placed This Encounter  Procedures  . Ambulatory referral to Dermatology     Meds ordered this encounter  Medications  . DISCONTD: Triamcinolone Acetonide (TRIAMCINOLONE 0.1 % CREAM : EUCERIN) CREA    Sig: Apply 1 Application topically 2 (two) times daily.    Dispense:  1 each    Refill:  1  . triamcinolone ointment (KENALOG) 0.1 %    Sig: Apply 1 Application topically 2 (two) times daily.    Dispense:  30 g    Refill:  0  . fexofenadine (ALLEGRA ALLERGY) 180 MG tablet    Sig: Take 1  tablet (180 mg total) by mouth daily.    Dispense:  10 tablet    Refill:  1     Follow up plan: No follow-ups on file.

## 2021-10-29 ENCOUNTER — Ambulatory Visit: Payer: Managed Care, Other (non HMO) | Admitting: Podiatry

## 2021-10-29 ENCOUNTER — Ambulatory Visit (INDEPENDENT_AMBULATORY_CARE_PROVIDER_SITE_OTHER): Payer: Managed Care, Other (non HMO)

## 2021-10-29 DIAGNOSIS — M205X1 Other deformities of toe(s) (acquired), right foot: Secondary | ICD-10-CM

## 2021-10-29 DIAGNOSIS — M722 Plantar fascial fibromatosis: Secondary | ICD-10-CM | POA: Diagnosis not present

## 2021-10-29 MED ORDER — MELOXICAM 15 MG PO TABS: 15.0000 mg | ORAL_TABLET | Freq: Every day | ORAL | 1 refills | Status: DC

## 2021-10-29 NOTE — Progress Notes (Signed)
Dg  

## 2021-11-10 DIAGNOSIS — M722 Plantar fascial fibromatosis: Secondary | ICD-10-CM | POA: Diagnosis not present

## 2021-11-10 MED ORDER — BETAMETHASONE SOD PHOS & ACET 6 (3-3) MG/ML IJ SUSP
3.0000 mg | Freq: Once | INTRAMUSCULAR | Status: AC
  Administered 2021-11-10: 3 mg via INTRA_ARTICULAR

## 2021-11-10 NOTE — Addendum Note (Signed)
Addended by: Edrick Kins on: 11/10/2021 12:34 PM   Modules accepted: Level of Service

## 2021-11-10 NOTE — Progress Notes (Signed)
   Chief Complaint  Patient presents with   Foot Pain    Bilateral foot pain.    Subjective: 51 y.o. male presenting as a reestablish patient to the office for evaluation of right forefoot pain this been going on for a few months now.  Patient has received injections in the past which have helped significantly.  He has not done anything currently for treatment other than OTC Aleve.  Presenting for further treatment and evaluation   Past Medical History:  Diagnosis Date   Allergy    GERD (gastroesophageal reflux disease)    History of kidney stones    Traumatic complete tear of right rotator cuff 10/18/2017   Past Surgical History:  Procedure Laterality Date   COLONOSCOPY WITH PROPOFOL N/A 08/26/2020   Procedure: COLONOSCOPY WITH PROPOFOL;  Surgeon: Jonathon Bellows, MD;  Location[facility] ENDOSCOPY;  Service: Gastroenterology;  Laterality: N/A;   LIPOMA EXCISION Left    side   SHOULDER ARTHROSCOPY WITH BICEPS TENDON REPAIR Right 11/14/2017   Procedure: SHOULDER ARTHROSCOPY WITH BICEPS TENDON REPAIR;  Surgeon: Elsie Saas, MD;  Location: [facility];  Service: Orthopedics;  Laterality: Right;   SHOULDER ARTHROSCOPY WITH ROTATOR CUFF REPAIR AND SUBACROMIAL DECOMPRESSION Right 11/14/2017   Procedure: SHOULDER ARTHROSCOPY WITH ROTATOR CUFF REPAIR AND SUBACROMIAL DECOMPRESSION AND LABRAL DEBRIDMENT;  Surgeon: Elsie Saas, MD;  Location: [facility];  Service: Orthopedics;  Laterality: Right;   Allergies  Allergen Reactions   Yeast-Related Products Hives     Objective: Physical Exam General: The patient is alert and oriented x3 in no acute distress.  Dermatology: Skin is warm, dry and supple bilateral lower extremities. Negative for open lesions or macerations bilateral.   Vascular: Dorsalis Pedis and Posterior Tibial pulses palpable bilateral.  Capillary fill time is immediate to all digits.  Neurological: Epicritic and protective threshold intact  bilateral.   Musculoskeletal: Tenderness to palpation to the plantar aspect of the right heel along the plantar fascia as well as pain on palpation range of motion of the first MTP right foot.  There is some limited range of motion as well to the joint.   Radiographic exam RT foot: Normal osseous mineralization.  Degenerative changes noted to the first MTP joint with joint space narrowing and periarticular spurring consistent with hallux limitus Assessment: 1. Plantar fasciitis right 2.  Hallux limitus right  Plan of Care:  1. Patient evaluated. Xrays reviewed.   2. Injection of 0.5cc Celestone soluspan injected into the right plantar fascia and first MTP right 3. Rx for Meloxicam 15 mg ordered for patient. 5.  Recommend good supportive shoes and sneakers.  Advised against going barefoot 6.  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 Edrick Kins, DPM    [address], [place] [address]                Office [phone]  Fax [phone]

## 2022-02-01 ENCOUNTER — Other Ambulatory Visit: Payer: Self-pay | Admitting: Podiatry

## 2022-02-14 ENCOUNTER — Telehealth (INDEPENDENT_AMBULATORY_CARE_PROVIDER_SITE_OTHER): Payer: Managed Care, Other (non HMO) | Admitting: Gastroenterology

## 2022-02-14 DIAGNOSIS — L290 Pruritus ani: Secondary | ICD-10-CM

## 2022-02-14 NOTE — Progress Notes (Signed)
Reginald [name] , MD [address]  [place]  [place], [place] [address]  Main: [phone]  Fax: [phone]   Primary Care Physician: Charlynne Cousins, MD (Inactive)  Virtual Visit via Video Note  I connected with patient on 02/14/22 at  4:15 PM EDT by video and verified that I am speaking with the correct person using two identifiers.   I discussed the limitations, risks, security and privacy concerns of performing an evaluation and management service by video  and the availability of in person appointments. I also discussed with the patient that there Rasch be a patient responsible charge related to this service. The patient expressed understanding and agreed to proceed.  Location of Patient: Home Location of Provider: Home Persons involved: Patient and provider only   History of Present Illness: Anal itching   HPI: Reginald [name] is a 51 y.o. male   He has previously been seen back in 2022 for GERD stable on dexilant 30 mg . Last colonoscopy in 2022 showed diverticulosis, internal hemorrhoids and a single sessile polyp that was resected. It was an adenoma.   Today he is here to see me for itching of the rectum. He says that has been ongoing for over a year, he doesn't wear any undergarments and worse when he wears jeans , feels the skin between his cheeks is raw , and sometimes leaves a stain on his pants. Denies any constipation .      Current Outpatient Medications  Medication Sig Dispense Refill   Dexlansoprazole (DEXILANT) 30 MG capsule Take 1 capsule (30 mg total) by mouth daily. 30 capsule 6   fenofibrate (TRICOR) 145 MG tablet Take 1 tablet (145 mg total) by mouth daily. 30 tablet 3   fexofenadine (ALLEGRA ALLERGY) 180 MG tablet Take 1 tablet (180 mg total) by mouth daily. 30 tablet 2   fexofenadine (ALLEGRA ALLERGY) 180 MG tablet Take 1 tablet (180 mg total) by mouth daily. 10 tablet 1   fluticasone (FLONASE) 50 MCG/ACT nasal spray Place 2 sprays into both nostrils  daily. 16 g 6   meloxicam (MOBIC) 15 MG tablet TAKE 1 TABLET BY MOUTH DAILY 30 tablet 1   triamcinolone ointment (KENALOG) 0.1 % Apply 1 Application topically 2 (two) times daily. 30 g 0   No current facility-administered medications for this visit.    Allergies as of 02/14/2022 - Review Complete 10/29/2021  Allergen Reaction Noted   Yeast-related products Hives 08/12/2020    Review of Systems:    All systems reviewed and negative except where noted in HPI.  General Appearance:    Alert, cooperative, no distress, appears stated age  Head:    Normocephalic, without obvious abnormality, atraumatic  Eyes:    PERRL, conjunctiva/corneas clear,  Ears:    Grossly normal hearing    Neurologic:  Grossly normal    Observations/Objective:  Labs: CMP     Component Value Date/Time   NA 140 11/24/2020 0820   K 4.4 11/24/2020 0820   CL 103 11/24/2020 0820   CO2 20 11/24/2020 0820   GLUCOSE 113 (H) 11/24/2020 0820   BUN 18 11/24/2020 0820   CREATININE 1.15 11/24/2020 0820   CALCIUM 9.3 11/24/2020 0820   PROT 6.8 11/24/2020 0820   ALBUMIN 4.3 11/24/2020 0820   AST 20 11/24/2020 0820   ALT 38 11/24/2020 0820   ALKPHOS 100 11/24/2020 0820   BILITOT 0.3 11/24/2020 0820   Lab Results  Component Value Date   WBC 8.3 11/24/2020  HGB 15.6 11/24/2020   HCT 46.4 11/24/2020   MCV 83 11/24/2020   PLT 214 11/24/2020    Imaging Studies: No results found.  Assessment and Plan:   Reginald [name] is a 51 y.o. y/o male here today to see me for anal pruiritis , likely a contact dermatitis due to not using any undergarments and likely due to friction from jeans pants on his peri anal area. In addition part of his symptoms likely from prolapsing hemorroids.   Plan  Internal hemorrhoids: Suggest conservative management with high-fiber diet with an aim to get at least 25 to 30 g of fiber per day.  In addition counseled on perianal hygiene and management including using good quality soft toilet  paper, cleaning the perianal area after bowel movement 2-3 times gently and if further cleansing required to use a showerhead to wash the area and pat dry with a cotton cloth/towel.  Suggest sitting in a warm water bath or a sitz bath at night for 15 to 20 minutes which would help decrease inflammation.    Usually conservative management helps resolve the issues with internal hemorrhoids and 50 to 60% of patients.  If it fails we could consider banding at the office. Advised to use undergarments and topical desitin .  2. Advised if no better in 2 weeks to come to my office for examination         I discussed the assessment and treatment plan with the patient. The patient was provided an opportunity to ask questions and all were answered. The patient agreed with the plan and demonstrated an understanding of the instructions.   The patient was advised to call back or seek an in-person evaluation if the symptoms worsen or if the condition fails to improve as anticipated.  I provided 20 minutes of face-to-face time during this encounter.  Dr Reginald Bellows MD,MRCP [facility]) Gastroenterology/Hepatology Pager: [phone]   Speech recognition software was used to dictate this note.

## 2022-02-24 ENCOUNTER — Other Ambulatory Visit: Payer: Self-pay

## 2022-02-24 NOTE — Telephone Encounter (Signed)
Medication refill for Triamcinolone ointment last ov 10/06/21, upcoming ov no future appt . Please advise

## 2022-03-23 ENCOUNTER — Ambulatory Visit: Payer: BC Managed Care – PPO | Admitting: Dermatology

## 2022-03-23 VITALS — BP 141/101 | HR 70

## 2022-03-23 DIAGNOSIS — Z79899 Other long term (current) drug therapy: Secondary | ICD-10-CM

## 2022-03-23 DIAGNOSIS — L502 Urticaria due to cold and heat: Secondary | ICD-10-CM | POA: Diagnosis not present

## 2022-03-23 DIAGNOSIS — L408 Other psoriasis: Secondary | ICD-10-CM

## 2022-03-23 DIAGNOSIS — L304 Erythema intertrigo: Secondary | ICD-10-CM | POA: Diagnosis not present

## 2022-03-23 DIAGNOSIS — L409 Psoriasis, unspecified: Secondary | ICD-10-CM

## 2022-03-23 MED ORDER — HYDROCORTISONE 2.5 % EX CREA: TOPICAL_CREAM | CUTANEOUS | 4 refills | Status: AC

## 2022-03-23 MED ORDER — ZORYVE 0.3 % EX CREA: 1.0000 | TOPICAL_CREAM | Freq: Every morning | CUTANEOUS | 4 refills | Status: DC

## 2022-03-23 NOTE — Patient Instructions (Addendum)
Recommend increasing Allegra to up to 4 pills a day when pt knows he will be out in the cold to prevent or treat hives   Instructions for [facility] Medications  One or more of your medications was sent to the [facility] mail order compounding pharmacy. You will receive an email from them and can purchase the medicine through that link. It will then be mailed to your home at the address you confirmed. If for any reason you do not receive an email from them, please check your spam folder. If you still do not find the email, please let us know. [facility] phone number is [phone].   Due to recent changes in healthcare laws, you Lebeck see results of your pathology and/or laboratory studies on MyChart before the doctors have had a chance to review them. We understand that in some cases there Mort be results that are confusing or concerning to you. Please understand that not all results are received at the same time and often the doctors Hunnell need to interpret multiple results in order to provide you with the best plan of care or course of treatment. Therefore, we ask that you please give [place] 2 business days to thoroughly review all your results before contacting the office for clarification. Should we see a critical lab result, you will be contacted sooner.    Your prescription was sent to [facility] in [place]. A representative from [facility] will contact you within 2 business hours to verify your address and insurance information to schedule a free delivery. If for any reason you do not receive a phone call from them, please reach out to them. Their phone number is [phone] and their hours are Monday-Friday 9:00 am-5:00 pm.     If You Need Anything After Your Visit  If you have any questions or concerns for your doctor, please call our main line at [phone] and press option 4 to reach your doctor's medical assistant. If no one answers, please leave a voicemail as  directed and we will return your call as soon as possible. Messages left after 4 pm will be answered the following business day.   You Crescenzo also send [place] a message via [place]. We typically respond to MyChart messages within 1-2 business days.  For prescription refills, please ask your pharmacy to contact our office. Our fax number is [phone].  If you have an urgent issue when the clinic is closed that cannot wait until the next business day, you can page your doctor at the number below.    Please note that while we do our best to be available for urgent issues outside of office hours, we are not available 24/7.   If you have an urgent issue and are unable to reach [place], you Ashmead choose to seek medical care at your doctor's office, retail clinic, urgent care center, or emergency room.  If you have a medical emergency, please immediately call 911 or go to the emergency department.  Pager Numbers  - Dr. Nehemiah Massed: [phone]  - Dr. Laurence Ferrari: [phone]  - Dr. Nicole Kindred: [phone]  In the event of inclement weather, please call our main line at [phone] for an update on the status of any delays or closures.  Dermatology Medication Tips: Please keep the boxes that topical medications come in in order to help keep track of the instructions about where and how to use these. Pharmacies typically print the medication instructions only on the boxes and not directly on  the medication tubes.   If your medication is too expensive, please contact our office at [phone] option 4 or send [place] a message through [place].   We are unable to tell what your co-pay for medications will be in advance as this is different depending on your insurance coverage. However, we Baucom be able to find a substitute medication at lower cost or fill out paperwork to get insurance to cover a needed medication.   If a prior authorization is required to get your medication covered by your insurance company, please  allow [place] 1-2 business days to complete this process.  Drug prices often vary depending on where the prescription is filled and some pharmacies Petz offer cheaper prices.  The website www.goodrx.com contains coupons for medications through different pharmacies. The prices here do not account for what the cost Warsame be with help from insurance (it Surgeon be cheaper with your insurance), but the website can give you the price if you did not use any insurance.  - You can print the associated coupon and take it with your prescription to the pharmacy.  - You Pappalardo also stop by our office during regular business hours and pick up a GoodRx coupon card.  - If you need your prescription sent electronically to a different pharmacy, notify our office through [facility] or by phone at [phone] option 4.     Si Usted Necesita Algo Despus de Su Visita  Tambin puede enviarnos un mensaje a travs de Pharmacist, community. Por lo general respondemos a los mensajes de MyChart en el transcurso de 1 a 2 das hbiles.  Para renovar recetas, por favor pida a su farmacia que se ponga en contacto con nuestra oficina. Harland Dingwall de fax es [place] [phone].  Si tiene un asunto urgente cuando la clnica est cerrada y que no puede esperar hasta el siguiente da hbil, puede llamar/localizar a su doctor(a) al nmero que aparece a continuacin.   Por favor, tenga en cuenta que aunque hacemos todo lo posible para estar disponibles para asuntos urgentes fuera del horario de [place], no estamos disponibles las 24 horas del da, los 7 das de la [place].   Si tiene un problema urgente y no puede comunicarse con nosotros, puede optar por buscar atencin mdica  en el consultorio de su doctor(a), en una clnica privada, en un centro de atencin urgente o en una sala de emergencias.  Si tiene Engineering geologist, por favor llame inmediatamente al 911 o vaya a la sala de emergencias.  Nmeros de bper  - Dr. Nehemiah Massed:  [phone]  - Dra. Moye: [phone]  - Dra. Nicole Kindred: [phone]  En caso de inclemencias del [place], por favor llame a Johnsie Kindred principal al [phone] para una actualizacin sobre el [place] de cualquier retraso o cierre.  Consejos para la medicacin en dermatologa: Por favor, guarde las cajas en las que vienen los medicamentos de uso tpico para ayudarle a seguir las instrucciones sobre dnde y cmo usarlos. Las farmacias generalmente imprimen las instrucciones del medicamento slo en las cajas y no directamente en los tubos del [place].   Si su medicamento es muy caro, por favor, pngase en contacto con Zigmund Daniel llamando al [phone] y presione la opcin 4 o envenos un mensaje a travs de Pharmacist, community.   No podemos decirle cul ser su copago por los medicamentos por adelantado ya que esto es diferente dependiendo de la cobertura de su seguro. Sin embargo, es posible que podamos encontrar un medicamento sustituto a  menor costo o llenar un formulario para que el seguro cubra el medicamento que se considera necesario.   Si se requiere una autorizacin previa para que su compaa de seguros [place] su medicamento, por favor permtanos de 1 a 2 das hbiles para completar este proceso.  Los precios de los medicamentos varan con frecuencia dependiendo del Environmental consultant de dnde se surte la receta y alguna farmacias pueden ofrecer precios ms baratos.  El sitio web www.goodrx.com tiene cupones para medicamentos de Airline pilot. Los precios aqu no tienen en cuenta lo que podra costar con la ayuda del seguro (puede ser ms barato con su seguro), pero el sitio web puede darle el precio si no utiliz Research scientist (physical sciences).  - Puede imprimir el cupn correspondiente y llevarlo con su receta a la farmacia.  - Tambin puede pasar por nuestra oficina durante el horario de atencin regular y Charity fundraiser una tarjeta de cupones de GoodRx.  - Si necesita que su receta se enve electrnicamente a  una farmacia diferente, informe a nuestra oficina a travs de MyChart de [place] o por telfono llamando al [phone] y presione la opcin 4.

## 2022-03-23 NOTE — Progress Notes (Signed)
New Patient Visit  Subjective  Reginald [name] is a 51 y.o. male who presents for the following: Urticaria (Hands, when hands or feet get cold pt breaks out in hives ~1.5 yrs, itchy, Benadryl prn flares, pt takes Allegra most days, TMC 0.1% oint prn flares) and Pruritis (Buttocks, no hx or fhx of psoriasis ).  New patient referral from Dr. Charlynne Cousins.  The following portions of the chart were reviewed this encounter and updated as appropriate:   Tobacco  Allergies  Meds  Problems  Med Hx  Surg Hx  Fam Hx     Review of Systems:  No other skin or systemic complaints except as noted in HPI or Assessment and Plan.  Objective  Well appearing patient in no apparent distress; mood and affect are within normal limits.  A focused examination was performed including hands, buttocks. Relevant physical exam findings are noted in the Assessment and Plan.  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   Hives [place] have multiple causes including stress, medications, infections, and systemic illness.  Sometimes there is a family history of chronic urticaria.   "Physical urticarias" Meisinger be caused by heat, sun, cold, vibration.   Insect bites can cause "papular urticaria". It is often difficult to find the cause of generalized hives.  Statistically, 70% of the time a cause of generalized hives is not found.  Sometimes hives can spontaneously resolve. Other times hives can persist and when it does, and no cause is found, and it has been at least 6 weeks since started, it is called "chronic  idiopathic urticaria". Antihistamines are the mainstay for treatment.  In severe cases Xolair injections [place] be used.   This is usually a clinical diagnosis based on history, with an ice cube challenge test to confirm the diagnosis in primary cold contact urticaria. The ice cube challenge test is done by exposing the patient's skin to an ice cube for 5 minutes and rewarming for 10 minutes. The test is positive if a wheal forms. Pt does get a wheal with Ice Cube test today.  For 2ndary Cold urticaria consider: Serum protein electrophoresis and immunofixation should be performed, along with measuring cryoglobulins and cryofibrinogen in suspected secondary cold contact urticaria. Consider additional testing for hepatitis B or C, lymphoproliferative disease, or infectious mononucleosis. Discussed with pt.  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   Recommend increasing Allegra to up to 4 po qd when pt knows he will be out in the cold -  to prevent or treat hives  Psoriasis Gluteal Crease With Psoriasis Inversa Psoriasis is a chronic non-curable, but treatable genetic/hereditary disease that Medero have other systemic features affecting other organ systems such as joints (Psoriatic Arthritis). It is associated with an increased risk of inflammatory bowel disease, heart disease, non-alcoholic fatty liver disease, and depression.     Start Zoryve cr qam 7 days per week.  Roflumilast (ZORYVE) 0.3 % CREA - Gluteal Crease Apply 1 Application topically every morning. Apply  to aa psoriasis on buttocks and knees qam  Intertrigo buttocks Intertrigo is a chronic recurrent rash that occurs in skin fold areas that Durkee be associated with friction; heat; moisture; yeast; fungus; and bacteria.  It is exacerbated by increased movement / activity; sweating; and higher atmospheric temperature.  Start SM Intertrigo cream hs  5d/wk, Monday - Friday  hydrocortisone 2.5 % cream - buttocks Apply topically as directed. Apply to aa psoriasis buttocks nightly up to 5 nights a week Monday through Friday  Return in about 4 months (around 07/23/2022) for Urticaria, Psorisis/Intertrigo.  I, Othelia Pulling, RMA, am acting as scribe for Sarina Ser, MD . Documentation: I have reviewed the above documentation for accuracy and completeness, and I agree with the above.  Sarina Ser, MD

## 2022-03-25 ENCOUNTER — Ambulatory Visit: Payer: BC Managed Care – PPO | Admitting: Podiatry

## 2022-03-25 DIAGNOSIS — M722 Plantar fascial fibromatosis: Secondary | ICD-10-CM | POA: Diagnosis not present

## 2022-04-01 DIAGNOSIS — M722 Plantar fascial fibromatosis: Secondary | ICD-10-CM

## 2022-04-01 MED ORDER — BETAMETHASONE SOD PHOS & ACET 6 (3-3) MG/ML IJ SUSP
3.0000 mg | Freq: Once | INTRAMUSCULAR | Status: AC
  Administered 2022-04-01: 3 mg via INTRA_ARTICULAR

## 2022-04-01 NOTE — Progress Notes (Signed)
   Chief Complaint  Patient presents with   Foot Pain    Patient is here for right heel pain, wants injection in right foot for pain.    Subjective: 51 y.o. male presenting as a reestablish patient to the office for evaluation of right forefoot pain this been going on for a few months now.  Patient has received injections in the past which have helped significantly.  He has not done anything currently for treatment other than OTC Aleve.  Presenting for further treatment and evaluation   Past Medical History:  Diagnosis Date   Allergy    GERD (gastroesophageal reflux disease)    History of kidney stones    Traumatic complete tear of right rotator cuff 10/18/2017   Past Surgical History:  Procedure Laterality Date   COLONOSCOPY WITH PROPOFOL N/A 08/26/2020   Procedure: COLONOSCOPY WITH PROPOFOL;  Surgeon: Jonathon Bellows, MD;  Location: [facility] ENDOSCOPY;  Service: Gastroenterology;  Laterality: N/A;   LIPOMA EXCISION Left    side   SHOULDER ARTHROSCOPY WITH BICEPS TENDON REPAIR Right 11/14/2017   Procedure: SHOULDER ARTHROSCOPY WITH BICEPS TENDON REPAIR;  Surgeon: Elsie Saas, MD;  Location: [place];  Service: Orthopedics;  Laterality: Right;   SHOULDER ARTHROSCOPY WITH ROTATOR CUFF REPAIR AND SUBACROMIAL DECOMPRESSION Right 11/14/2017   Procedure: SHOULDER ARTHROSCOPY WITH ROTATOR CUFF REPAIR AND SUBACROMIAL DECOMPRESSION AND LABRAL DEBRIDMENT;  Surgeon: Elsie Saas, MD;  Location: [place];  Service: Orthopedics;  Laterality: Right;   Allergies  Allergen Reactions   Yeast-Related Products Hives     Objective: Physical Exam General: The patient is alert and oriented x3 in no acute distress.  Dermatology: Skin is warm, dry and supple bilateral lower extremities. Negative for open lesions or macerations bilateral.   Vascular: Dorsalis Pedis and Posterior Tibial pulses palpable bilateral.  Capillary fill time is immediate to all  digits.  Neurological: Epicritic and protective threshold intact bilateral.   Musculoskeletal: Tenderness to palpation to the plantar aspect of the right heel along the plantar fascia as well as pain on palpation range of motion of the first MTP right foot.  There is some limited range of motion as well to the joint.   Radiographic exam RT foot B/L feet 10/29/2021: Normal osseous mineralization.  Degenerative changes noted to the first MTP joint with joint space narrowing and periarticular spurring consistent with hallux limitus  Assessment: 1. Plantar fasciitis right 2.  Hallux limitus right  Plan of Care:  1. Patient evaluated. Xrays reviewed.   2. Injection of 0.5cc Celestone soluspan injected into the right plantar fascia and first MTP right 3. Rx for Meloxicam 15 mg ordered for patient. 5.  Recommend good supportive shoes and sneakers.  Advised against going barefoot 6.  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 Edrick Kins, DPM    [address], [place] [address]                Office [phone]  Fax [phone]

## 2022-04-03 ENCOUNTER — Encounter: Payer: Self-pay | Admitting: Dermatology

## 2022-04-15 ENCOUNTER — Encounter: Payer: Self-pay | Admitting: Podiatry

## 2022-04-15 ENCOUNTER — Ambulatory Visit: Payer: BC Managed Care – PPO | Admitting: Podiatry

## 2022-04-15 VITALS — BP 138/90 | HR 70

## 2022-04-15 DIAGNOSIS — M7671 Peroneal tendinitis, right leg: Secondary | ICD-10-CM

## 2022-04-15 DIAGNOSIS — M205X1 Other deformities of toe(s) (acquired), right foot: Secondary | ICD-10-CM | POA: Diagnosis not present

## 2022-04-15 DIAGNOSIS — M722 Plantar fascial fibromatosis: Secondary | ICD-10-CM | POA: Diagnosis not present

## 2022-04-15 MED ORDER — BETAMETHASONE SOD PHOS & ACET 6 (3-3) MG/ML IJ SUSP
3.0000 mg | Freq: Once | INTRAMUSCULAR | Status: AC
  Administered 2022-04-15: 3 mg via INTRA_ARTICULAR

## 2022-04-15 NOTE — Progress Notes (Signed)
Chief Complaint  Patient presents with   Plantar Fasciitis    "It's a little better than it was the last time.  The pain has moved to the other side of my heel."    Subjective: 51 y.o. male presenting for follow-up evaluation of plantar fasciitis to the right foot.  Patient states that the pain along the heel has resolved.  He no longer has any pain or tenderness associated to this area.  He has been experiencing some pain to the lateral aspect of the right ankle.  He believes this Traber be due to compensation.  He works on his feet for prolonged period of time.  He denies a history of trauma.  Presenting for further treatment evaluation   Past Medical History:  Diagnosis Date   Allergy    GERD (gastroesophageal reflux disease)    History of kidney stones    Traumatic complete tear of right rotator cuff 10/18/2017   Past Surgical History:  Procedure Laterality Date   COLONOSCOPY WITH PROPOFOL N/A 08/26/2020   Procedure: COLONOSCOPY WITH PROPOFOL;  Surgeon: Jonathon Bellows, MD;  [facility] END[facility]COPY;  Service: Gastroenterology;  Laterality: N/A;   LIPOMA EXCISION Left    side   SHOULDER ARTHROSCOPY WITH BICEPS TENDON REPAIR Right 11/14/2017   Procedure: SHOULDER ARTHROSCOPY WITH BICEPS TENDON REPAIR;  Surgeon: Elsie Saas, MD;  Location: [facility];  Service: Orthopedics;  Laterality: Right;   SHOULDER ARTHROSCOPY WITH ROTATOR CUFF REPAIR AND SUBACROMIAL DECOMPRESSION Right 11/14/2017   Procedure: SHOULDER ARTHROSCOPY WITH ROTATOR CUFF REPAIR AND SUBACROMIAL DECOMPRESSION AND LABRAL DEBRIDMENT;  Surgeon: Elsie Saas, MD;  Location: [facility];  Service: Orthopedics;  Laterality: Right;   Allergies  Allergen Reactions   Yeast-Related Products Hives     Objective: Physical Exam General: The patient is alert and oriented x3 in no acute distress.  Dermatology: Skin is warm, dry and supple bilateral lower extremities. Negative for open lesions or  macerations bilateral.   Vascular: Dorsalis Pedis and Posterior Tibial pulses palpable bilateral.  Capillary fill time is immediate to all digits.  Neurological: Epicritic and protective threshold intact bilateral.   Musculoskeletal: Tenderness to palpation along the lateral aspect of the ankle just posterior to the lateral malleolus along the peroneal tendons.  Consistent with peroneal tendinitis.  No pain on palpation throughout the plantar fascia or first MTP of the right foot  Radiographic exam RT foot B/L feet 10/29/2021: Normal osseous mineralization.  Degenerative changes noted to the first MTP joint with joint space narrowing and periarticular spurring consistent with hallux limitus  Assessment: 1. Plantar fasciitis right 2.  Hallux limitus right 3.  Peroneal tendinitis right  Plan of Care:  1. Patient evaluated. Xrays reviewed.   2. Injection of 0.5cc Celestone soluspan injected along the peroneal tendon sheath right 3.  Continue meloxicam 15 mg daily as needed 5.  Recommend good supportive shoes and sneakers.  Advised against going barefoot 6.  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 Edrick Kins, DPM    [address], [place] [address]  Office [phone]  Fax [phone]

## 2022-05-16 ENCOUNTER — Other Ambulatory Visit: Payer: Self-pay | Admitting: Podiatry

## 2022-06-13 IMAGING — DX DG ABDOMEN 2V
4 series · 4 of 4 positions shown · non-contrast
Comparison: None.

CLINICAL DATA: Epigastric lump.

EXAM:
ABDOMEN - 2 VIEW

[abdomen erect (1 of 2)]
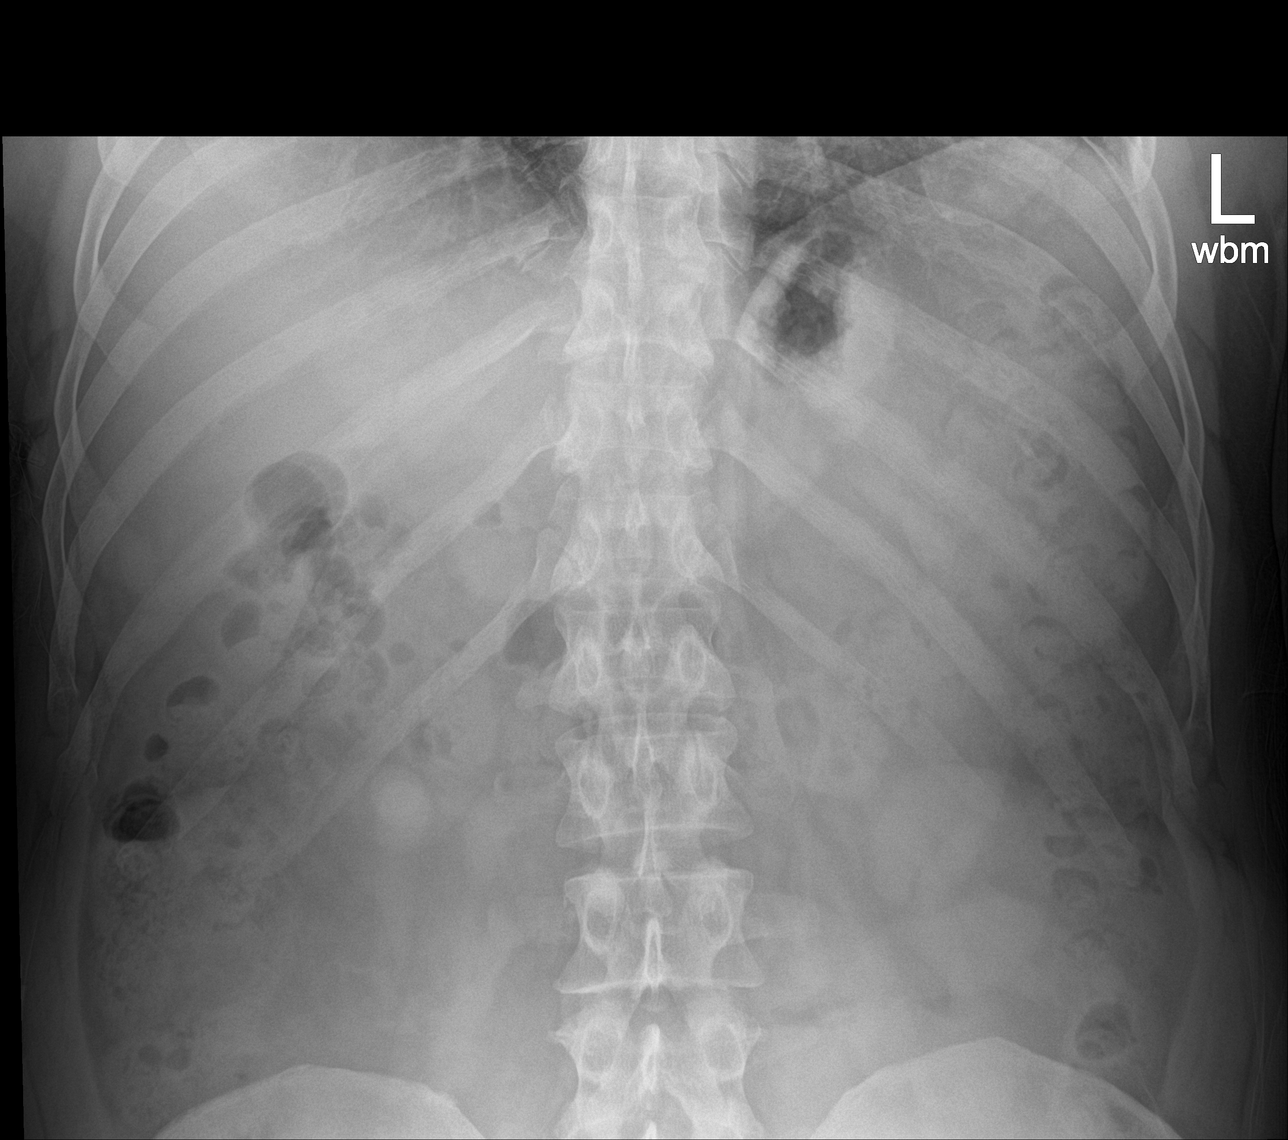

[abdomen supine (1 of 2)]
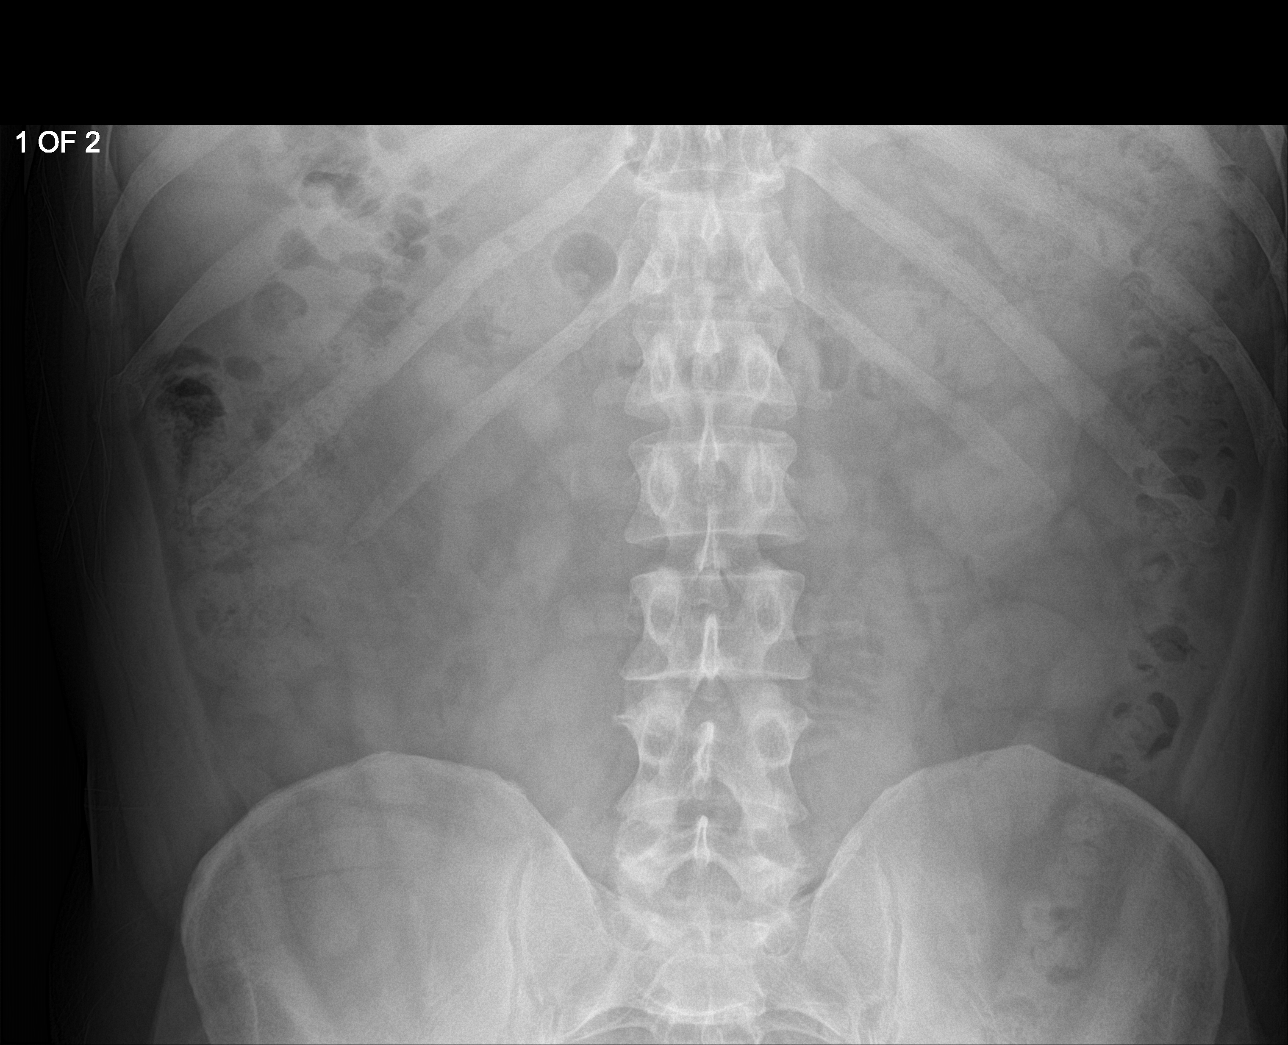

[abdomen supine (2 of 2)]
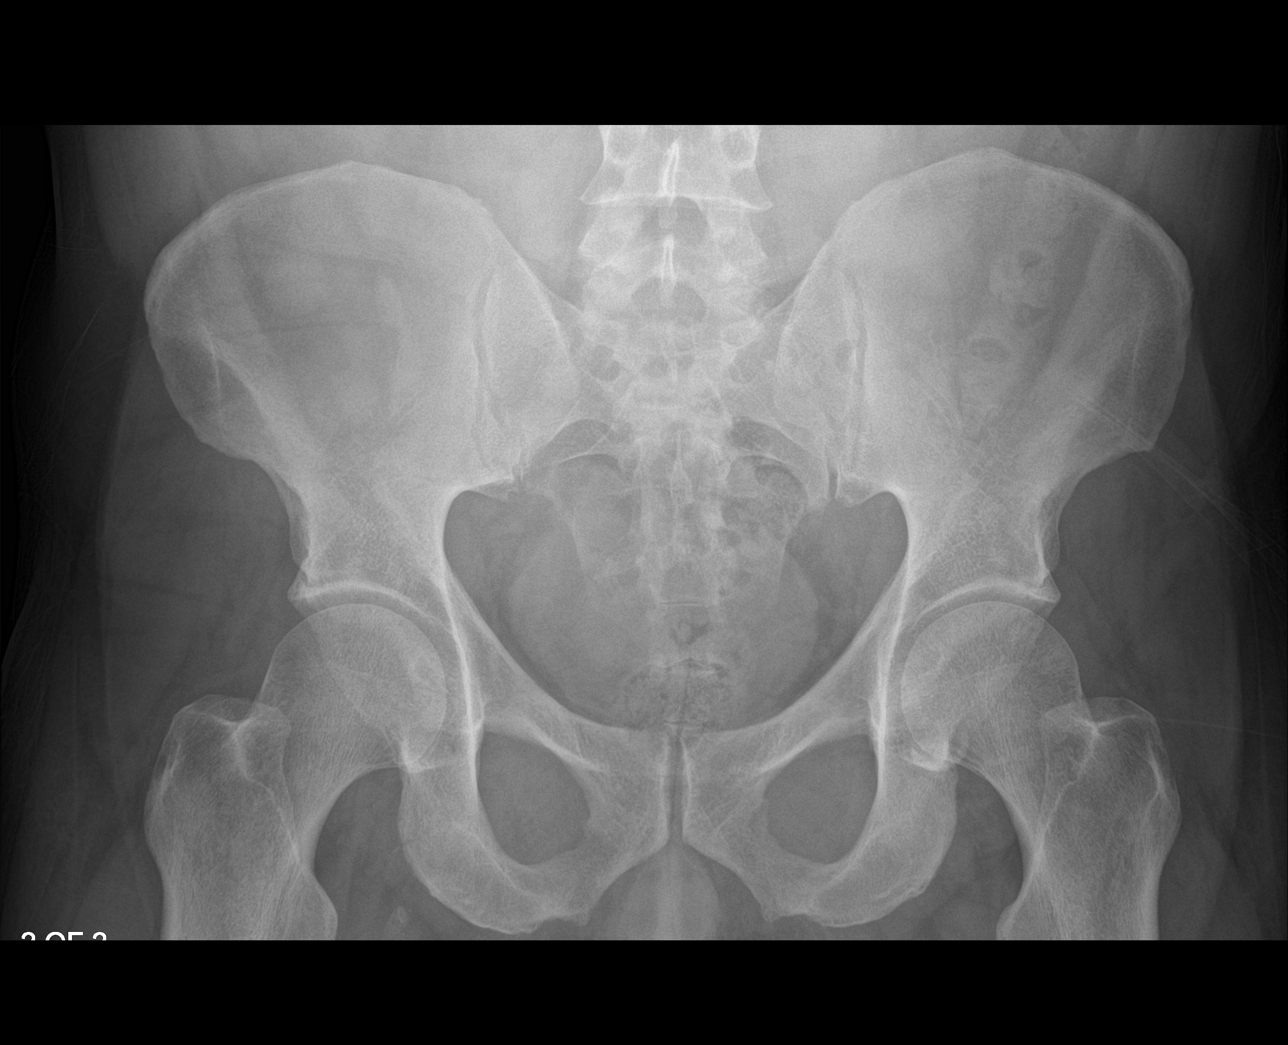

[abdomen erect (2 of 2)]
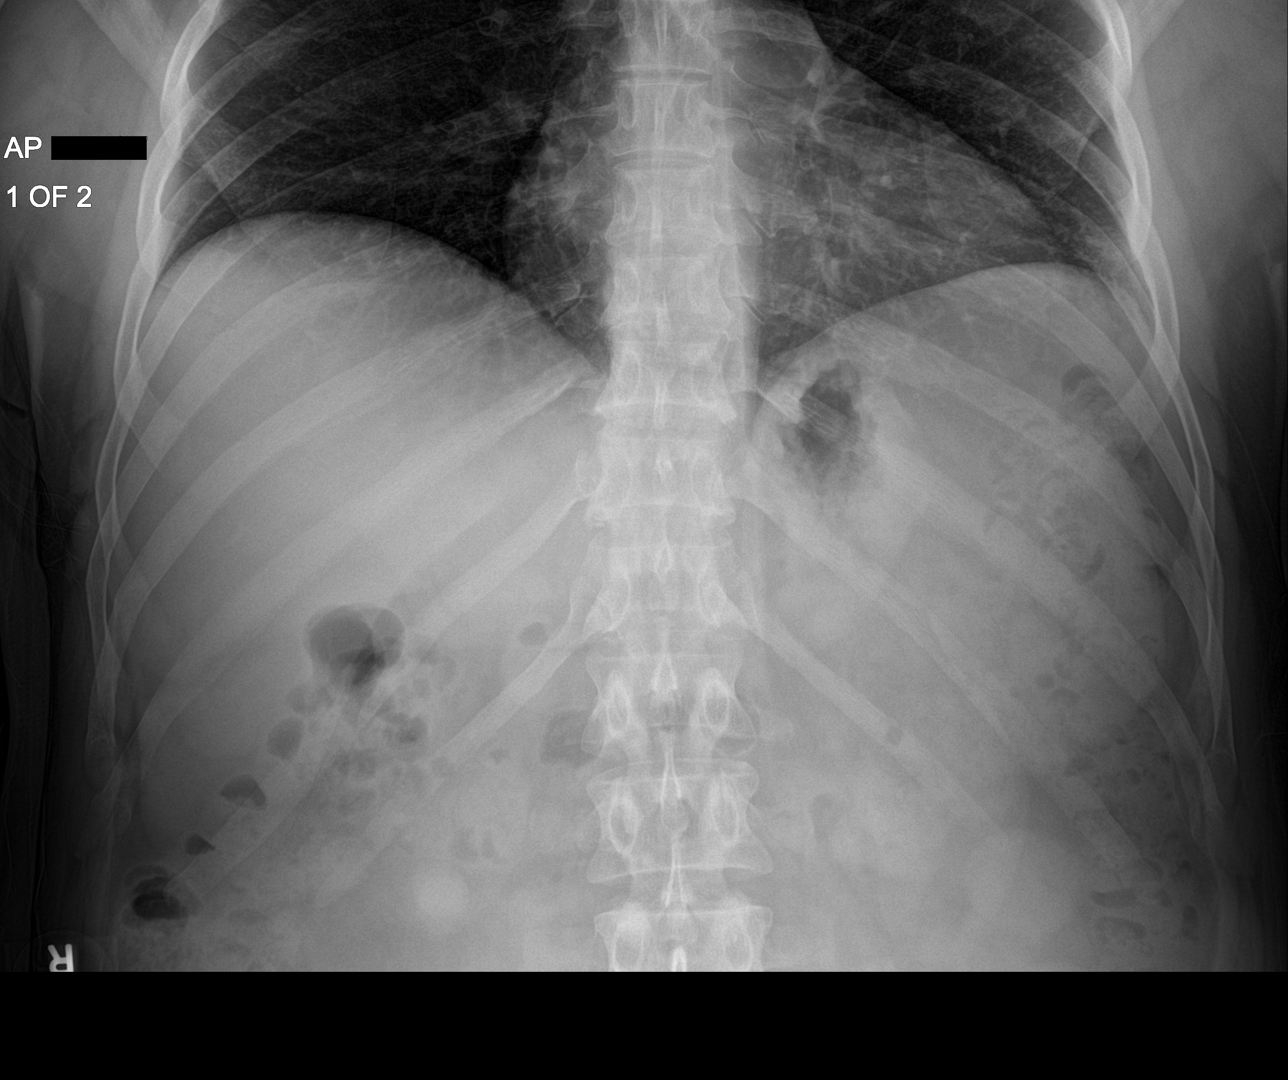

[4 of 4 positions shown; findings below may reference images not displayed]

FINDINGS: The bowel gas pattern is normal. There is no evidence of free air.
No radio-opaque calculi or other significant radiographic
abnormality is seen.
IMPRESSION: Negative.

## 2022-06-24 ENCOUNTER — Other Ambulatory Visit: Payer: Self-pay | Admitting: Podiatry

## 2022-07-27 ENCOUNTER — Other Ambulatory Visit: Payer: Self-pay | Admitting: Podiatry

## 2022-08-03 ENCOUNTER — Ambulatory Visit: Payer: BC Managed Care – PPO | Admitting: Dermatology

## 2022-08-03 VITALS — BP 138/87 | HR 79

## 2022-08-03 DIAGNOSIS — L409 Psoriasis, unspecified: Secondary | ICD-10-CM | POA: Diagnosis not present

## 2022-08-03 DIAGNOSIS — Z79899 Other long term (current) drug therapy: Secondary | ICD-10-CM

## 2022-08-03 DIAGNOSIS — L408 Other psoriasis: Secondary | ICD-10-CM | POA: Diagnosis not present

## 2022-08-03 DIAGNOSIS — L439 Lichen planus, unspecified: Secondary | ICD-10-CM

## 2022-08-03 DIAGNOSIS — L304 Erythema intertrigo: Secondary | ICD-10-CM | POA: Diagnosis not present

## 2022-08-03 DIAGNOSIS — Z7189 Other specified counseling: Secondary | ICD-10-CM

## 2022-08-03 MED ORDER — LIDOCAINE 5 % EX OINT: TOPICAL_OINTMENT | CUTANEOUS | 5 refills | Status: AC

## 2022-08-03 NOTE — Patient Instructions (Signed)
Due to recent changes in healthcare laws, you Delisa see results of your pathology and/or laboratory studies on MyChart before the doctors have had a chance to review them. We understand that in some cases there Tatar be results that are confusing or concerning to you. Please understand that not all results are received at the same time and often the doctors Degollado need to interpret multiple results in order to provide you with the best plan of care or course of treatment. Therefore, we ask that you please give us 2 business days to thoroughly review all your results before contacting the office for clarification. Should we see a critical lab result, you will be contacted sooner.   If You Need Anything After Your Visit  If you have any questions or concerns for your doctor, please call our main line at [phone] and press option 4 to reach your doctor's medical assistant. If no one answers, please leave a voicemail as directed and we will return your call as soon as possible. Messages left after 4 pm will be answered the following business day.   You Michaelis also send us a message via MyChart. We typically respond to MyChart messages within 1-2 business days.  For prescription refills, please ask your pharmacy to contact our office. Our fax number is [phone].  If you have an urgent issue when the clinic is closed that cannot wait until the next business day, you can page your doctor at the number below.    Please note that while we do our best to be available for urgent issues outside of office hours, we are not available 24/7.   If you have an urgent issue and are unable to reach us, you Roach choose to seek medical care at your doctor's office, retail clinic, urgent care center, or emergency room.  If you have a medical emergency, please immediately call 911 or go to the emergency department.  Pager Numbers  - Dr. Kowalski: [phone]  - Dr. Moye: [phone]  - Dr. Stewart:  [phone]  In the event of inclement weather, please call our main line at [phone] for an update on the status of any delays or closures.  Dermatology Medication Tips: Please keep the boxes that topical medications come in in order to help keep track of the instructions about where and how to use these. Pharmacies typically print the medication instructions only on the boxes and not directly on the medication tubes.   If your medication is too expensive, please contact our office at [phone] option 4 or send us a message through MyChart.   We are unable to tell what your co-pay for medications will be in advance as this is different depending on your insurance coverage. However, we Sabater be able to find a substitute medication at lower cost or fill out paperwork to get insurance to cover a needed medication.   If a prior authorization is required to get your medication covered by your insurance company, please allow us 1-2 business days to complete this process.  Drug prices often vary depending on where the prescription is filled and some pharmacies [place] offer cheaper prices.  The website www.goodrx.com contains coupons for medications through different pharmacies. The prices here do not account for what the cost Delafuente be with help from insurance (it Treanor be cheaper with your insurance), but the website can give you the price if you did not use any insurance.  - You can print the associated coupon and take it with   your prescription to the pharmacy.  - You Lapinski also stop by our office during regular business hours and pick up a GoodRx coupon card.  - If you need your prescription sent electronically to a different pharmacy, notify our office through [place] MyChart or by phone at [phone] option 4.     Si Usted Necesita Algo Despus de Su Visita  Tambin puede enviarnos un mensaje a travs de MyChart. Por lo general respondemos a los mensajes de MyChart en el transcurso de 1 a 2  das hbiles.  Para renovar recetas, por favor pida a su farmacia que se ponga en contacto con nuestra oficina. Nuestro nmero de fax es el [phone].  Si tiene un asunto urgente cuando la clnica est cerrada y que no puede esperar hasta el siguiente da hbil, puede llamar/localizar a su doctor(a) al nmero que aparece a continuacin.   Por favor, tenga en cuenta que aunque hacemos todo lo posible para estar disponibles para asuntos urgentes fuera del horario de oficina, no estamos disponibles las 24 horas del da, los 7 das de la semana.   Si tiene un problema urgente y no puede comunicarse con nosotros, puede optar por buscar atencin mdica  en el consultorio de su doctor(a), en una clnica privada, en un centro de atencin urgente o en una sala de emergencias.  Si tiene una emergencia mdica, por favor llame inmediatamente al 911 o vaya a la sala de emergencias.  Nmeros de bper  - Dr. Kowalski: [phone]  - Dra. Moye: [phone]  - Dra. Stewart: [phone]  En caso de inclemencias del tiempo, por favor llame a nuestra lnea principal al [phone] para una actualizacin sobre el estado de cualquier retraso o cierre.  Consejos para la medicacin en dermatologa: Por favor, guarde las cajas en las que vienen los medicamentos de uso tpico para ayudarle a seguir las instrucciones sobre dnde y cmo usarlos. Las farmacias generalmente imprimen las instrucciones del medicamento slo en las cajas y no directamente en los tubos del medicamento.   Si su medicamento es muy caro, por favor, pngase en contacto con nuestra oficina llamando al [phone] y presione la opcin 4 o envenos un mensaje a travs de MyChart.   No podemos decirle cul ser su copago por los medicamentos por adelantado ya que esto es diferente dependiendo de la cobertura de su seguro. Sin embargo, es posible que podamos encontrar un medicamento sustituto a menor costo o llenar un formulario para que el  seguro cubra el medicamento que se considera necesario.   Si se requiere una autorizacin previa para que su compaa de seguros cubra su medicamento, por favor permtanos de 1 a 2 das hbiles para completar este proceso.  Los precios de los medicamentos varan con frecuencia dependiendo del lugar de dnde se surte la receta y alguna farmacias pueden ofrecer precios ms baratos.  El sitio web www.goodrx.com tiene cupones para medicamentos de diferentes farmacias. Los precios aqu no tienen en cuenta lo que podra costar con la ayuda del seguro (puede ser ms barato con su seguro), pero el sitio web puede darle el precio si no utiliz ningn seguro.  - Puede imprimir el cupn correspondiente y llevarlo con su receta a la farmacia.  - Tambin puede pasar por nuestra oficina durante el horario de atencin regular y recoger una tarjeta de cupones de GoodRx.  - Si necesita que su receta se enve electrnicamente a una farmacia diferente, informe a nuestra oficina a travs de MyChart de [place]   o por telfono llamando al [phone] y presione la opcin 4.  

## 2022-08-03 NOTE — Progress Notes (Signed)
   Follow-Up Visit   Subjective  Reginald [name] is a 52 y.o. 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 Pt currently using Zoryve cream daily and the Skin Medicinals mix QD PRN intertrigo flares.   The following portions of the chart were reviewed this encounter and updated as appropriate: medications, allergies, medical history  Review of Systems:  No other skin or systemic complaints except as noted in HPI or Assessment and Plan.  Objective  Well appearing patient in no apparent distress; mood and affect are within normal limits.  Areas Examined: The face and buttock  Relevant exam findings are noted in the Assessment and Plan.  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   R inf buttocks 4.0 cm slightly pink, indurated, and scaly patch 1% BSA.  Chronic and persistent condition with duration or expected duration over one year. Condition is symptomatic/ bothersome to patient. Not currently at goal.  Patient denies joint pain except for joint pain in the foot due to injury.   Treatment Plan: Continue Zoryve cream to aa's QD PRN.  Start Skin Medicinals anti-itch cream to aa before going to sleep.   Counseling on psoriasis and coordination of care  psoriasis is a chronic non-curable, but treatable genetic/hereditary disease that Cardona have other systemic features affecting other organ systems such as joints (Psoriatic Arthritis). It is associated with an increased risk of inflammatory bowel disease, heart disease, non-alcoholic fatty liver disease, and depression.  Treatments include light and laser treatments; topical medications; and systemic medications including oral and injectables.  Intertrigo Buttocks Intertrigo is a chronic recurrent rash that  occurs in skin fold areas that Flom be associated with friction; heat; moisture; yeast; fungus; and bacteria.  It is exacerbated by increased movement / activity; sweating; and higher atmospheric temperature.   Continue SM Intertrigo cream hs 5d/wk, Monday - Friday  Return in about 6 months (around 02/02/2023) for psoriasis inversa and intertrigo follow up .  Maylene Roes, CMA, am acting as scribe for Armida Sans, MD .  Documentation: I have reviewed the above documentation for accuracy and completeness, and I agree with the above.  Armida Sans, MD

## 2022-08-15 ENCOUNTER — Encounter: Payer: Self-pay | Admitting: Dermatology

## 2022-11-18 ENCOUNTER — Other Ambulatory Visit: Payer: Self-pay | Admitting: Podiatry

## 2023-02-02 ENCOUNTER — Ambulatory Visit (INDEPENDENT_AMBULATORY_CARE_PROVIDER_SITE_OTHER): Payer: Self-pay | Admitting: Dermatology

## 2023-02-02 DIAGNOSIS — L299 Pruritus, unspecified: Secondary | ICD-10-CM

## 2023-02-02 DIAGNOSIS — L409 Psoriasis, unspecified: Secondary | ICD-10-CM

## 2023-02-02 DIAGNOSIS — Z79899 Other long term (current) drug therapy: Secondary | ICD-10-CM

## 2023-02-02 DIAGNOSIS — L280 Lichen simplex chronicus: Secondary | ICD-10-CM

## 2023-02-02 DIAGNOSIS — Z7189 Other specified counseling: Secondary | ICD-10-CM

## 2023-02-02 NOTE — Progress Notes (Signed)
   Follow-Up Visit   Subjective  Reginald [name] is a 52 y.o. male who presents for the following: Psoriasis with inversa and LSC - currently using Zorvye cream which does help almost clear area, but then he flares back up. Patient was doing well when using Zoryve daily, but he forgot a few days and started to flare. Pt states that he didn't fill the Skin Medicinals anti itch medication.   The following portions of the chart were reviewed this encounter and updated as appropriate: medications, allergies, medical history  Review of Systems:  No other skin or systemic complaints except as noted in HPI or Assessment and Plan.  Objective  Well appearing patient in no apparent distress; mood and affect are within normal limits.  Areas Examined: The face  Relevant exam findings are noted in the Assessment and Plan.   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    R inf buttocks 3.5 cm slightly pink, indurated, and scaly patch 1% BSA.   Chronic and persistent condition with duration or expected duration over one year. Condition is symptomatic/ bothersome to patient. Not currently at goal.   Patient denies joint pain except for joint pain in the foot due to injury.    Treatment Plan: Continue Zoryve cream to aa's QD PRN.  Discussed [place] 1/2 tab po QD if patient is interested in more aggressive treatment. Samples given of Otezla titration packs. Side effects of Otezla (apremilast) include diarrhea, nausea, headache, upper respiratory infection, depression, and weight decrease (5-10%). It should only be taken by pregnant women after a discussion regarding risks and benefits with their doctor. Goal is control of skin condition, not cure.  The use of Henderson [place] requires long term medication management, including periodic office  visits.  Pt to report condition on Otezla via MyChart. If tolerating well will send in prescription.   Return in about 6 months (around 08/03/2023) for psoriasis follow up.  Maylene Roes, CMA, am acting as scribe for Armida Sans, MD .  Documentation: I have reviewed the above documentation for accuracy and completeness, and I agree with the above.  Armida Sans, MD

## 2023-02-02 NOTE — Patient Instructions (Addendum)
Side effects of Otezla (apremilast) include diarrhea, nausea, headache, upper respiratory infection, depression, and weight decrease (5-10%). It should only be taken by pregnant women after a discussion regarding risks and benefits with their doctor. Goal is control of skin condition, not cure.  The use of Henderson [place] requires long term medication management, including periodic office visits.  Due to recent changes in healthcare laws, you Josephs see results of your pathology and/or laboratory studies on MyChart before the doctors have had a chance to review them. We understand that in some cases there Meras be results that are confusing or concerning to you. Please understand that not all results are received at the same time and often the doctors Bacot need to interpret multiple results in order to provide you with the best plan of care or course of treatment. Therefore, we ask that you please give [place] 2 business days to thoroughly review all your results before contacting the office for clarification. Should we see a critical lab result, you will be contacted sooner.   If You Need Anything After Your Visit  If you have any questions or concerns for your doctor, please call our main line at [phone] and press option 4 to reach your doctor's medical assistant. If no one answers, please leave a voicemail as directed and we will return your call as soon as possible. Messages left after 4 pm will be answered the following business day.   You Mourer also send [place] a message via MyChart. We typically respond to MyChart messages within 1-2 business days.  For prescription refills, please ask your pharmacy to contact our office. Our fax number is [phone].  If you have an urgent issue when the clinic is closed that cannot wait until the next business day, you can page your doctor at the number below.    Please note that while we do our best to be available for urgent issues outside of office hours, we are not  available 24/7.   If you have an urgent issue and are unable to reach [place], you Bark choose to seek medical care at your doctor's office, retail clinic, urgent care center, or emergency room.  If you have a medical emergency, please immediately call 911 or go to the emergency department.  Pager Numbers  - Dr. Gwen Pounds: [phone]  - Dr. Roseanne Reno: [phone]  - Dr. Katrinka Blazing: [phone]   In the event of inclement weather, please call our main line at [phone] for an update on the status of any delays or closures.  Dermatology Medication Tips: Please keep the boxes that topical medications come in in order to help keep track of the instructions about where and how to use these. Pharmacies typically print the medication instructions only on the boxes and not directly on the medication tubes.   If your medication is too expensive, please contact our office at [phone] option 4 or send [place] a message through MyChart.   We are unable to tell what your co-pay for medications will be in advance as this is different depending on your insurance coverage. However, we Humphrey be able to find a substitute medication at lower cost or fill out paperwork to get insurance to cover a needed medication.   If a prior authorization is required to get your medication covered by your insurance company, please allow [place] 1-2 business days to complete this process.  Drug prices often vary depending on where the prescription is filled and some pharmacies [place] offer cheaper prices.  The  website www.goodrx.com contains coupons for medications through different pharmacies. The prices here do not account for what the cost Nelms be with help from insurance (it Dwight be cheaper with your insurance), but the website can give you the price if you did not use any insurance.  - You can print the associated coupon and take it with your prescription to the pharmacy.  - You Lacross also stop by our office during regular business hours  and pick up a GoodRx coupon card.  - If you need your prescription sent electronically to a different pharmacy, notify our office through [facility] or by phone at [phone] option 4.     Si Usted Necesita Algo Despus de Su Visita  Tambin puede enviarnos un mensaje a travs de Clinical cytogeneticist. Por lo general respondemos a los mensajes de MyChart en el transcurso de 1 a 2 das hbiles.  Para renovar recetas, por favor pida a su farmacia que se ponga en contacto con nuestra oficina. Annie Sable de fax es [place] [phone].  Si tiene un asunto urgente cuando la clnica est cerrada y que no puede esperar hasta el siguiente da hbil, puede llamar/localizar a su doctor(a) al nmero que aparece a continuacin.   Por favor, tenga en cuenta que aunque hacemos todo lo posible para estar disponibles para asuntos urgentes fuera del horario de [place], no estamos disponibles las 24 horas del da, los 7 [address]  [address] de la [place].   Si tiene un problema urgente y no puede comunicarse con nosotros, puede optar por buscar atencin mdica  en el consultorio de su doctor(a), en una clnica privada, en un centro de atencin urgente o en una sala de emergencias.  Si tiene Engineer, drilling, por favor llame inmediatamente al 911 o vaya a la sala de emergencias.  Nmeros de bper  - Dr. Gwen Pounds: [phone]  - Dra. Roseanne Reno: [phone]  - Dr. Katrinka Blazing: [phone]   En caso de inclemencias del tiempo, por favor llame a Lacy Duverney principal al [phone] para una actualizacin sobre el [place] de cualquier retraso o cierre.  Consejos para la medicacin en dermatologa: Por favor, guarde las cajas en las que vienen los medicamentos de uso tpico para ayudarle a seguir las instrucciones sobre dnde y cmo usarlos. Las farmacias generalmente imprimen las instrucciones del medicamento slo en las cajas y no directamente en los tubos del [place].   Si su medicamento es muy caro, por favor, pngase  en contacto con Rolm Gala llamando al [phone] y presione la opcin 4 o envenos un mensaje a travs de Clinical cytogeneticist.   No podemos decirle cul ser su copago por los medicamentos por adelantado ya que esto es diferente dependiendo de la cobertura de su seguro. Sin embargo, es posible que podamos encontrar un medicamento sustituto a Audiological scientist un formulario para que el seguro cubra el medicamento que se considera necesario.   Si se requiere una autorizacin previa para que su compaa de seguros [place] su medicamento, por favor permtanos de 1 a 2 das hbiles para completar [address]  Los precios de los medicamentos varan con frecuencia dependiendo del Environmental consultant de dnde se surte la receta y alguna farmacias pueden ofrecer precios ms baratos.  El sitio web www.goodrx.com tiene cupones para medicamentos de Health and safety inspector. Los precios aqu no tienen en cuenta lo que podra costar con la ayuda del seguro (puede ser ms barato con su seguro), pero el sitio web puede darle el precio si no utiliz Tourist information centre manager.  -  Puede imprimir el cupn correspondiente y llevarlo con su receta a la farmacia.  - Tambin puede pasar por nuestra oficina durante el horario de atencin regular y Education officer, museum una tarjeta de cupones de GoodRx.  - Si necesita que su receta se enve electrnicamente a una farmacia diferente, informe a nuestra oficina a travs de MyChart de  o por telfono llamando al [phone] y presione la opcin 4.

## 2023-02-14 ENCOUNTER — Encounter: Payer: Self-pay | Admitting: Dermatology

## 2023-09-20 ENCOUNTER — Encounter: Payer: Self-pay | Admitting: Dermatology

## 2023-09-20 ENCOUNTER — Ambulatory Visit: Payer: Self-pay | Admitting: Dermatology

## 2023-09-20 DIAGNOSIS — L280 Lichen simplex chronicus: Secondary | ICD-10-CM

## 2023-09-20 DIAGNOSIS — W57XXXA Bitten or stung by nonvenomous insect and other nonvenomous arthropods, initial encounter: Secondary | ICD-10-CM

## 2023-09-20 DIAGNOSIS — Z7189 Other specified counseling: Secondary | ICD-10-CM

## 2023-09-20 DIAGNOSIS — S20361A Insect bite (nonvenomous) of right front wall of thorax, initial encounter: Secondary | ICD-10-CM

## 2023-09-20 DIAGNOSIS — L408 Other psoriasis: Secondary | ICD-10-CM

## 2023-09-20 DIAGNOSIS — Z79899 Other long term (current) drug therapy: Secondary | ICD-10-CM

## 2023-09-20 DIAGNOSIS — L409 Psoriasis, unspecified: Secondary | ICD-10-CM

## 2023-09-20 MED ORDER — DOXYCYCLINE MONOHYDRATE 100 MG PO CAPS: 100.0000 mg | ORAL_CAPSULE | Freq: Two times a day (BID) | ORAL | 0 refills | Status: AC

## 2023-09-20 MED ORDER — SERNIVO 0.05 % EX EMUL: 1.0000 | CUTANEOUS | 2 refills | Status: DC

## 2023-09-20 NOTE — Patient Instructions (Addendum)
 Increase Zoryve  cream to 7 days a week to affected area buttocks, one to two times a day, if flared use 2 times a day Start Sernivo  3 nights a week to affected area of buttocks, avoid using on face, underarms and in groin   At 6 weeks send mychart message to let us  know if medication is working   Start Sernivo  twice a day to tick bite  Your prescription has been sent to [facility]  Here's how it works:  Step 1 Call [phone] or click on confidential link in text message to confirm your prescription.  (Please allow 1-2 business days)  Step 2 A representative will collect your insurance, billing and shipping information or go to [email or web address] to sign up and pay for your prescription electronically.  Step 3 Your prescription will be shipped free to your home.   Due to recent changes in healthcare laws, you Hohman see results of your pathology and/or laboratory studies on MyChart before the doctors have had a chance to review them. We understand that in some cases there Brave be results that are confusing or concerning to you. Please understand that not all results are received at the same time and often the doctors Sitts need to interpret multiple results in order to provide you with the best plan of care or course of treatment. Therefore, we ask that you please give us  2 business days to thoroughly review all your results before contacting the office for clarification. Should we see a critical lab result, you will be contacted sooner.   If You Need Anything After Your Visit  If you have any questions or concerns for your doctor, please call our main line at [phone] and press option 4 to reach your doctor's medical assistant. If no one answers, please leave a voicemail as directed and we will return your call as soon as possible. Messages left after 4 pm will be answered the following business day.   You Sobczyk also send us  a message via MyChart. We typically respond to MyChart messages within 1-2  business days.  For prescription refills, please ask your pharmacy to contact our office. Our fax number is [phone].  If you have an urgent issue when the clinic is closed that cannot wait until the next business day, you can page your doctor at the number below.    Please note that while we do our best to be available for urgent issues outside of office hours, we are not available 24/7.   If you have an urgent issue and are unable to reach us , you Zucker choose to seek medical care at your doctor's office, retail clinic, urgent care center, or emergency room.  If you have a medical emergency, please immediately call 911 or go to the emergency department.  Pager Numbers  - Dr. Bary Likes: [phone]  - Dr. Annette Barters: [phone]  - Dr. Felipe Horton: [phone]   In the event of inclement weather, please call our main line at [phone] for an update on the status of any delays or closures.  Dermatology Medication Tips: Please keep the boxes that topical medications come in in order to help keep track of the instructions about where and how to use these. Pharmacies typically print the medication instructions only on the boxes and not directly on the medication tubes.   If your medication is too expensive, please contact our office at [phone] option 4 or send us  a message through MyChart.   We are unable to tell what your  co-pay for medications will be in advance as this is different depending on your insurance coverage. However, we Simi be able to find a substitute medication at lower cost or fill out paperwork to get insurance to cover a needed medication.   If a prior authorization is required to get your medication covered by your insurance company, please allow us  1-2 business days to complete this process.  Drug prices often vary depending on where the prescription is filled and some pharmacies Pasion offer cheaper prices.  The website www.goodrx.com contains coupons for  medications through different pharmacies. The prices here do not account for what the cost Mitzel be with help from insurance (it Delap be cheaper with your insurance), but the website can give you the price if you did not use any insurance.  - You can print the associated coupon and take it with your prescription to the pharmacy.  - You Prochnow also stop by our office during regular business hours and pick up a GoodRx coupon card.  - If you need your prescription sent electronically to a different pharmacy, notify our office through [facility] or by phone at [phone] option 4.     Si Usted Necesita Algo Despus de Su Visita  Tambin puede enviarnos un mensaje a travs de Clinical cytogeneticist. Por lo general respondemos a los mensajes de MyChart en el transcurso de 1 a 2 das hbiles.  Para renovar recetas, por favor pida a su farmacia que se ponga en contacto con nuestra oficina. Franz Jacks de fax es [place] [phone].  Si tiene un asunto urgente cuando la clnica est cerrada y que no puede esperar hasta el siguiente da hbil, puede llamar/localizar a su doctor(a) al nmero que aparece a continuacin.   Por favor, tenga en cuenta que aunque hacemos todo lo posible para estar disponibles para asuntos urgentes fuera del horario de Rudolph, no estamos disponibles las 24 horas del da, los 7 [address]  [address] de la [place].   Si tiene un problema urgente y no puede comunicarse con nosotros, puede optar por buscar atencin mdica  en el consultorio de su doctor(a), en una clnica privada, en un centro de atencin urgente o en una sala de emergencias.  Si tiene Engineer, drilling, por favor llame inmediatamente al 911 o vaya a la sala de emergencias.  Nmeros de bper  - Dr. Bary Likes: [phone]  - Dra. Annette Barters: [phone]  - Dr. Felipe Horton: [phone]   En caso de inclemencias del tiempo, por favor llame a Lajuan Pila principal al [phone] para una actualizacin sobre el [place] de cualquier retraso  o cierre.  Consejos para la medicacin en dermatologa: Por favor, guarde las cajas en las que vienen los medicamentos de uso tpico para ayudarle a seguir las instrucciones sobre dnde y cmo usarlos. Las farmacias generalmente imprimen las instrucciones del medicamento slo en las cajas y no directamente en los tubos del [place].   Si su medicamento es muy caro, por favor, pngase en contacto con Bettyjane Brunet llamando al [phone] y presione la opcin 4 o envenos un mensaje a travs de Clinical cytogeneticist.   No podemos decirle cul ser su copago por los medicamentos por adelantado ya que esto es diferente dependiendo de la cobertura de su seguro. Sin embargo, es posible que podamos encontrar un medicamento sustituto a Audiological scientist un formulario para que el seguro cubra el medicamento que se considera necesario.   Si se requiere una autorizacin previa para que su compaa de seguros [place] su medicamento,  por favor permtanos de 1 a 2 das hbiles para completar [address]  Los precios de los medicamentos varan con frecuencia dependiendo del Environmental consultant de dnde se surte la receta y alguna farmacias pueden ofrecer precios ms baratos.  El sitio web www.goodrx.com tiene cupones para medicamentos de Health and safety inspector. Los precios aqu no tienen en cuenta lo que podra costar con la ayuda del seguro (puede ser ms barato con su seguro), pero el sitio web puede darle el precio si no utiliz Tourist information centre manager.  - Puede imprimir el cupn correspondiente y llevarlo con su receta a la farmacia.  - Tambin puede pasar por nuestra oficina durante el horario de atencin regular y Education officer, museum una tarjeta de cupones de GoodRx.  - Si necesita que su receta se enve electrnicamente a una farmacia diferente, informe a nuestra oficina a travs de MyChart de [place] o por telfono llamando al [phone] y presione la opcin 4.

## 2023-09-20 NOTE — Progress Notes (Unsigned)
 Follow-Up Visit   Subjective  Reginald [name] is a 53 y.o. 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  The following portions of the chart were reviewed this encounter and updated as appropriate: medications, allergies, medical history  Review of Systems:  No other skin or systemic complaints except as noted in HPI or Assessment and Plan.  Objective  Well appearing patient in no apparent distress; mood and affect are within normal limits.  A focused examination was performed of the following areas: buttock  Relevant exam findings are noted in the Assessment and Plan.   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 Chronic and persistent condition with duration or expected duration over one year. Condition is bothersome/symptomatic for patient. Currently flared.  patient denies joint pain  Psoriasis is a chronic non-curable, but treatable genetic/hereditary disease that [place] have other systemic features affecting other organ systems such as joints (Psoriatic Arthritis). It is associated with an increased risk of inflammatory bowel disease, heart disease, non-alcoholic fatty liver disease, and depression.  Treatments include light and laser treatments; topical medications; and systemic medications including oral and injectables.  Treatment Plan: Increase Zoryve  cr to qd/bid  7 days /wk, when flared can increase to bid  Start Sernivo  3d/wk to aa buttocks, avoid f/g/a Sample x 1 given Lot # [id number] exp 03/2025 Pt to contact office (myChart) in 6 weeks after using meds diligently to let us  know how doing.  At that time if doing well, Bordon continue treatment prn.  If not doing well, we could discuss systemic treatment.  Topical steroids (such as triamcinolone , fluocinolone, fluocinonide, mometasone, clobetasol, halobetasol, betamethasone , hydrocortisone ) can cause thinning and lightening of the skin if they are used for too long in the same area. Your physician has selected the right strength medicine for your problem and area affected on the body. Please use your medication only as directed by your physician to prevent side effects.    BITE REACTION - Tick -  Removed within 12 hours R chest Exam: pink pap R chest  Treatment Plan: Start Sernivo  bid to bite Start Doxycycline  100mg  1 po bid x 1 month, take with food and drink (start if symptoms)  Pt has had Lyme and RMSF both in past.  Doxycycline  should be taken with food to prevent nausea. Do not lay down for 30 minutes after taking. Be cautious with sun exposure and use good sun protection while on this medication. Pregnant women should not take this medication.   PSORIASIS   Related Medications Roflumilast  (ZORYVE ) 0.3 % CREA Apply 1 Application topically every morning. Apply to aa psoriasis on buttocks and knees qam COUNSELING AND COORDINATION OF CARE   MEDICATION MANAGEMENT   INSECT BITE OF RIGHT FRONT WALL OF THORAX, INITIAL ENCOUNTER    Return in  about 6 months (around 03/21/2024) for Psoriasis f/u.  I, Rollie Clipper, RMA, am acting as scribe for Celine Collard, MD .   Documentation: I have reviewed the above documentation for accuracy and completeness, and I agree with the above.  Celine Collard, MD

## 2023-09-27 ENCOUNTER — Telehealth: Payer: Self-pay

## 2023-09-27 NOTE — Telephone Encounter (Signed)
 Left pt a message to please call and let us  know if he was able to get the Sernivo , and if so is he using the Sernivo  and Zoryve  for his psoriasis./sh

## 2023-12-11 ENCOUNTER — Other Ambulatory Visit: Payer: Self-pay | Admitting: Dermatology

## 2024-03-07 ENCOUNTER — Other Ambulatory Visit: Payer: Self-pay

## 2024-03-07 ENCOUNTER — Emergency Department (HOSPITAL_COMMUNITY)
Admission: EM | Admit: 2024-03-07 | Discharge: 2024-03-07 | Disposition: A | Discharge status: Home / Self Care | Source: Ambulatory Visit | Attending: Emergency Medicine | Admitting: Emergency Medicine

## 2024-03-07 ENCOUNTER — Emergency Department (HOSPITAL_COMMUNITY)

## 2024-03-07 DIAGNOSIS — Z79899 Other long term (current) drug therapy: Secondary | ICD-10-CM | POA: Insufficient documentation

## 2024-03-07 DIAGNOSIS — I83811 Varicose veins of right lower extremities with pain: Secondary | ICD-10-CM | POA: Insufficient documentation

## 2024-03-07 DIAGNOSIS — M79661 Pain in right lower leg: Secondary | ICD-10-CM | POA: Insufficient documentation

## 2024-03-07 DIAGNOSIS — R52 Pain, unspecified: Secondary | ICD-10-CM | POA: Diagnosis not present

## 2024-03-07 DIAGNOSIS — M7989 Other specified soft tissue disorders: Secondary | ICD-10-CM | POA: Diagnosis not present

## 2024-03-07 NOTE — ED Provider Notes (Signed)
 [place] EMERGENCY DEPARTMENT AT [facility] Provider Note   CSN: [id number] Arrival date & time: 03/07/24  1547     Patient presents with: Leg Pain   Reginald [name] is a 53 y.o. male with past medical history of kidney stone, GERD, Bell's palsy, HLD presents to Emergency Department for evaluation of RLE swelling, pain.  Initially, he was evaluated at urgent care who recommended ED evaluation to rule out DVT.  Patient's wife at bedside reports that patient recently started testosterone 1 week ago and is concerned this Pundt have contributed.  No history of DVT, recent travel, recent immobilization, recent surgeries, chest pain, shortness of breath, fevers    Leg Pain     Prior to Admission medications   Medication Sig Start Date End Date Taking? Authorizing Provider  Dexlansoprazole  (DEXILANT ) 30 MG capsule Take 1 capsule (30 mg total) by mouth daily. 08/12/20   Vigg, Avanti, MD  doxycycline  (MONODOX ) 100 MG capsule Take 1 capsule (100 mg total) by mouth 2 (two) times daily. Take with food and drink 09/20/23   Hester Alm BROCKS, MD  fenofibrate  (TRICOR ) 145 MG tablet Take 1 tablet (145 mg total) by mouth daily. 11/25/20   Vigg, Avanti, MD  fexofenadine  (ALLEGRA  ALLERGY) 180 MG tablet Take 1 tablet (180 mg total) by mouth daily. 08/12/20   Vigg, Avanti, MD  fexofenadine  (ALLEGRA  ALLERGY) 180 MG tablet Take 1 tablet (180 mg total) by mouth daily. 10/06/21   Vigg, Avanti, MD  fluticasone  (FLONASE ) 50 MCG/ACT nasal spray Place 2 sprays into both nostrils daily. 08/12/20   Vigg, Avanti, MD  hydrocortisone  2.5 % cream Apply topically as directed. Apply to aa psoriasis buttocks nightly up to 5 nights a week Monday through Friday 03/23/22   Hester Alm BROCKS, MD  lidocaine  (XYLOCAINE ) 5 % ointment Apply to itchy areas QD before going to sleep. 08/03/22   Hester Alm BROCKS, MD  meloxicam  (MOBIC ) 15 MG tablet TAKE ONE TABLET BY MOUTH DAILY 07/28/22   Evans, Brent M, DPM  Roflumilast  (ZORYVE ) 0.3 %  CREA Apply 1 Application topically every morning. Apply to aa psoriasis on buttocks and knees qam 03/23/22   Hester Alm BROCKS, MD  SERNIVO  0.05 % EMUL Apply topically to buttocks three days a week as directed til clear then as needed for flares. Avoid face, groin, axilla 12/11/23   Hester Alm BROCKS, MD  triamcinolone  ointment (KENALOG ) 0.1 % Apply 1 Application topically 2 (two) times daily. 10/06/21   Vigg, Avanti, MD    Allergies: Yeast-derived drug products    Review of Systems  Skin:  Positive for color change.    Updated Vital Signs BP 121/89   Pulse 80   Temp 98 F (36.7 C)   Resp 16   SpO2 100%   Physical Exam Vitals and nursing note reviewed.  Constitutional:      General: He is not in acute distress.    Appearance: Normal appearance.  HENT:     Head: Normocephalic and atraumatic.  Eyes:     Conjunctiva/sclera: Conjunctivae normal.  Cardiovascular:     Rate and Rhythm: Normal rate.     Pulses:          Dorsalis pedis pulses are 2+ on the right side and 2+ on the left side.  Pulmonary:     Effort: Pulmonary effort is normal. No respiratory distress.  Musculoskeletal:     Comments: Varicose veins to RLE. Mild swelling and tenderness to posterior aspect of right calf. No  streaking nor signs of cellulitis. LLE wo erythema, warmth, swelling.  Skin:    Coloration: Skin is not jaundiced or pale.  Neurological:     Mental Status: He is alert. Mental status is at baseline.     (all labs ordered are listed, but only abnormal results are displayed) Labs Reviewed - No data to display  EKG: None  Radiology: VAS US  LOWER EXTREMITY VENOUS (DVT) (ONLY [facility] & [facility]) Result Date: 03/07/2024  Lower Venous DVT Study Patient Name:  Reginald [name]  Date of Exam:   03/07/2024 Medical Rec #: [id number]    Accession #:    [id number] Date of Birth: [birth year]   Patient Gender: M Patient Age:   80 years Exam Location:  [facility] Procedure:      VAS US  LOWER EXTREMITY VENOUS (DVT)  Referring Phys: TINNIE MATTER --------------------------------------------------------------------------------  Indications: Pain and swelling  Risk Factors: Recently began hormone therapy (testosterone). Comparison Study: No prior study on file Performing Technologist: Jody Hill RVT, RDMS  Examination Guidelines: A complete evaluation includes B-mode imaging, spectral Doppler, color Doppler, and power Doppler as needed of all accessible portions of each vessel. Bilateral testing is considered an integral part of a complete examination. Limited examinations for reoccurring indications Greb be performed as noted. The reflux portion of the exam is performed with the patient in reverse Trendelenburg.  +---------+---------------+---------+-----------+----------+---------------+ RIGHT    CompressibilityPhasicitySpontaneityPropertiesThrombus Aging  +---------+---------------+---------+-----------+----------+---------------+ CFV      Full           Yes      Yes                                  +---------+---------------+---------+-----------+----------+---------------+ SFJ      Full                                                         +---------+---------------+---------+-----------+----------+---------------+ FV Prox  Full           Yes      Yes                                  +---------+---------------+---------+-----------+----------+---------------+ FV Mid   Full           Yes      Yes                                  +---------+---------------+---------+-----------+----------+---------------+ FV DistalFull           Yes      Yes                                  +---------+---------------+---------+-----------+----------+---------------+ PFV      Full                                                         +---------+---------------+---------+-----------+----------+---------------+ POP  Full           Yes      Yes                                   +---------+---------------+---------+-----------+----------+---------------+ PTV      Full                                                         +---------+---------------+---------+-----------+----------+---------------+ PERO     Full                                                         +---------+---------------+---------+-----------+----------+---------------+ GSV      Full                               dilated   in area of calf +---------+---------------+---------+-----------+----------+---------------+ VV's     Full                               dilated   in area of calf +---------+---------------+---------+-----------+----------+---------------+ GSV and varicose veins in calf appear dilated, however are fully compressible.  +----+---------------+---------+-----------+----------+--------------+ LEFTCompressibilityPhasicitySpontaneityPropertiesThrombus Aging +----+---------------+---------+-----------+----------+--------------+ CFV Full           Yes      No                                  +----+---------------+---------+-----------+----------+--------------+     Summary: RIGHT: - There is no evidence of deep vein thrombosis in the lower extremity.  - No cystic structure found in the popliteal fossa.  LEFT: - No evidence of common femoral vein obstruction.   *See table(s) above for measurements and observations. Electronically signed by Debby Robertson on 03/07/2024 at 7:58:34 PM.    Final     Medications Ordered in the ED - No data to display                                  Medical Decision Making  Patient presents to the ED for concern of RLE swelling, pain, this involves an extensive number of treatment options, and is a complaint that carries with it a high risk of complications and morbidity.  The differential diagnosis includes DVT, superficial phlebitis, cellulitis, traumatic injury, vascular injury   Co morbidities that complicate the patient  evaluation  Recently started testosterone   Additional history obtained:  Additional history obtained from  Nursing   External records from outside source obtained and reviewed including triage RN note     Imaging Studies ordered:  I ordered imaging studies including DVT US   I independently visualized and interpreted imaging which showed no DVT I agree with the radiologist interpretation    Problem List / ED Course:  RLE pain RLE swelling Vital signs hemodynamically stable with no fever no tachycardia DVT US  negative for DVT No signs  of cellulitis, streaking on exam No signs of vascular injury nor ischemic limb. DP2+ with good cap refill Will treat edema, enlarged varicose veins with cool compresses, NSAIDs, elevation Can f/u with PCP   Reevaluation:  After the interventions noted above, I reevaluated the patient and found that they have :improved    Dispostion:  After consideration of the diagnostic results and the patients response to treatment, I feel that the patent would benefit from outpatient management with PCP f/u.   Discussed ED workup, disposition, return to ED precautions with patient who expresses understanding agrees with plan.  All questions answered to their satisfaction.  They are agreeable to plan.  Discharge instructions provided on paperwork  Final diagnoses:  Pain and swelling of right lower leg    ED Discharge Orders     None        Minnie Tinnie BRAVO, PA 03/07/24 2258    Cottie Donnice PARAS, MD 03/07/24 [phone]

## 2024-03-07 NOTE — Progress Notes (Signed)
 Subjective Patient ID: Reginald [name] is a 52 y.o. male.    54 year old male presents to clinic complaining of right lower leg pain and swelling for the past 2 days.  Patient states that he noted a mild pain that has progressively worsened over the last couple of days.  He notes that the pain is in the posterior aspect of his calf and seems to be worse with weightbearing and walking and somewhat manageable at rest.  He noted yesterday and this morning that his leg was more swollen.  He denies any trauma.  He started a new supplement last week and his wife has concerns that this is attributed to his current complaint.  He does not smoke.  No other complaints on today's visit.   History provided by:  Patient   Review of Systems  Constitutional:  Negative for chills, fatigue and fever.  Gastrointestinal:  Negative for abdominal pain, diarrhea, nausea and vomiting.  Musculoskeletal:  Negative for arthralgias and myalgias.       Right lower leg pain  Neurological:  Positive for numbness. Negative for weakness.   Patient History  Allergies: No Known Allergies  History reviewed. No pertinent past medical history. History reviewed. No pertinent surgical history. Social History   Socioeconomic History  . Marital status: Not on file    Spouse name: Not on file  . Number of children: Not on file  . Years of education: Not on file  . Highest education level: Not on file  Occupational History  . Not on file  Tobacco Use  . Smoking status: Never  . Smokeless tobacco: Never  Substance and Sexual Activity  . Alcohol use: Not on file  . Drug use: Not on file  . Sexual activity: Not on file  Other Topics Concern  . Not on file  Social History Narrative  . Not on file   History reviewed. No pertinent family history. Current Outpatient Medications on File Prior to Visit  Medication Sig Dispense Refill  . fexofenadine  (Allegra ) 180 MG tablet Take 180 mg by mouth 1 (one) time each day.    .  hydrocortisone  2.5 % cream Apply topically in the morning and in the evening.    . triamcinolone  (Kenalog ) 0.1 % ointment Apply 1 Application topically in the morning and 1 Application in the evening.     No current facility-administered medications on file prior to visit.    Objective  Vitals:   03/07/24 1435 03/07/24 1437  BP: (!) 158/83 132/75  Pulse: 85 71  Resp: 18   Temp: (!) 37.8 C (100.1 F)   TempSrc: Oral   SpO2: 95%   Weight: 87.1 kg   Height: 5' 9   PainSc:   8      Physical Exam  GENERAL APPEARANCE: afebrile, non-toxic and not acutely ill-appearing in NAD. Patient is speaking in full sentences with no increased work of breathing. EYES: EOM intact, PERRLA, with no conjunctival injection noted. HEART: RRR, S1 and S2 present with no obvious murmur noted. No rubs, gallops or clicks noted. LUNGS: CTA bilaterally with no wheezing, ronchi, rales or crackles noted.  SKIN: Moderate tenderness noted to the posterior aspect of the right lower leg. Peripheral pulses are present and bounding. Adequate peripheral capillary refill is noted. Right calf measures 4.2 cm and the left calf measures 3.8.  MSK: ROM of the right lower legs is intact with mild pain noted with dorsiflexion of the right foot.  NEURO: Alert and oriented x 3. No  motor or sensory deficits noted to the right lower leg.   No results found for this visit on 03/07/24.     Procedures MDM:     1+ Acute illness or injury that poses a threat to life or bodily function     Explanation of Medical Decision Making and variances from expected care:    Patient's history of present illness and physical exam findings are consistent and concerning for an acute DVT to the right lower extremity.  Advised the patient that he needs higher level of care while demand labs and advanced imaging Keirsey be utilized to formulate an appropriate diagnosis and treatment plan.  Patient's wife both verbalized understanding and she will  transport the patient to the emergency department via POV.  All questions were answered prior to the discharge from the clinic.    Assessment requiring historian other than patient: No     Independent visualization of image, tracing, or test: No     Discussion of management with another provider: No     Risk:: High         Assessment/Plan Diagnoses and all orders for this visit:  Pain and swelling of right lower leg   Disposition Status: Emergency Department  Patient Instructions  Advised the patient that he needs a higher level of care where on demand labs and advanced imaging Uriarte be used to form a definitive diagnosis and aid in formulating an appropriate treatment plan.   Progress note signed by Fairy Jenny, PA on 03/07/24 at  3:14 PM

## 2024-03-07 NOTE — Progress Notes (Signed)
 RLE venous duplex has been completed.  Preliminary results given to Tinnie Matter, PA-C.   Results can be found under chart review under CV PROC. 03/07/2024 6:56 PM Rayhaan Huster RVT, RDMS

## 2024-03-07 NOTE — ED Provider Triage Note (Signed)
 Emergency Medicine Provider Triage Evaluation Note  Reginald [name] , a 53 y.o. male  was evaluated in triage.  Pt complains of RLE pain, swelling started yesterday morning. Was evaluated by UC who recommended ED evaluation for DVT US  Started taking testosterone sunday  Review of Systems  Positive: See hpi Negative:   Physical Exam  There were no vitals taken for this visit. Gen:   Awake, no distress   Resp:  Normal effort MSK:   Moves extremities without difficulty  Other:  Mild swelling to posterior prox calf. Ttp of calf and popliteal  Medical Decision Making  Medically screening exam initiated at 4:00 PM.  Appropriate orders placed.  Reginald [name] was informed that the remainder of the evaluation will be completed by another provider, this initial triage assessment does not replace that evaluation, and the importance of remaining in the ED until their evaluation is complete.   Minnie Tinnie BRAVO, PA 03/07/24 [phone]

## 2024-03-07 NOTE — Discharge Instructions (Signed)
 Thank you for letting us  evaluate you today.  Your ultrasound was negative for DVT.  We will treat this as a superficial phlebitis although this does not also show on the ultrasound.  Please make sure to elevate legs, warm compresses, ibuprofen.  Let your PCP know about this.  Return to emergency room if you experience chest pain, shortness of breath, streaking in leg, extreme worsening of swelling, redness especially with fever.

## 2024-03-07 NOTE — ED Triage Notes (Incomplete)
 Patient c/o leg pain and swelling  x 1 day. PCP recommended to be seen for possible DVT. Patient denies Chest pain and SOB.

## 2024-03-08 ENCOUNTER — Telehealth: Payer: Self-pay | Admitting: Internal Medicine

## 2024-03-08 NOTE — Telephone Encounter (Signed)
 Copied from CRM #[id number]. Topic: Appointments - Transfer of Care >> Mar 08, 2024 10:59 AM Deaijah H wrote: Pt is requesting to transfer FROM: [facility], [place] Pt is requesting to transfer TO: [facility]  Reason for requested transfer: Closer to his house It is the responsibility of the team the patient would like to transfer to (Dr. Randeen) to reach out to the patient if for any reason this transfer is not acceptable.   ----------------------------------------------------------------------- From previous Reason for Contact - Scheduling Inquiry for Clinic: Reason for CRM:

## 2024-03-08 NOTE — Telephone Encounter (Signed)
 My first available opening is likely in winter/spring? Later  Can transfer if he pt wants to wait

## 2024-03-21 ENCOUNTER — Ambulatory Visit: Payer: Self-pay | Admitting: Urology

## 2024-03-21 VITALS — BP 147/88 | HR 80 | Ht 69.0 in | Wt 193.0 lb

## 2024-03-21 DIAGNOSIS — Z3009 Encounter for other general counseling and advice on contraception: Secondary | ICD-10-CM

## 2024-03-21 MED ORDER — DIAZEPAM 10 MG PO TABS: 10.0000 mg | ORAL_TABLET | Freq: Once | ORAL | 0 refills | Status: AC | PRN

## 2024-03-21 NOTE — Progress Notes (Signed)
 03/21/24 4:02 PM   Reginald [name] 07-Aug-1970 [id number]  CC: Discuss vasectomy  HPI: 53 year old male with no children, does not desire any biologic pregnancies.  Interested in vasectomy for permanent sterilization.   PMH: Past Medical History:  Diagnosis Date   Allergy    GERD (gastroesophageal reflux disease)    History of kidney stones    Traumatic complete tear of right rotator cuff 10/18/2017    Surgical History: Past Surgical History:  Procedure Laterality Date   COLONOSCOPY WITH PROPOFOL  N/A 08/26/2020   Procedure: COLONOSCOPY WITH PROPOFOL ;  Surgeon: Therisa Bi, MD;  Location: [facility];  Service: Gastroenterology;  Laterality: N/A;   LIPOMA EXCISION Left    side   SHOULDER ARTHROSCOPY WITH BICEPS TENDON REPAIR Right 11/14/2017   Procedure: SHOULDER ARTHROSCOPY WITH BICEPS TENDON REPAIR;  Surgeon: Jane Charleston, MD;  Location: [facility];  Service: Orthopedics;  Laterality: Right;   SHOULDER ARTHROSCOPY WITH ROTATOR CUFF REPAIR AND SUBACROMIAL DECOMPRESSION Right 11/14/2017   Procedure: SHOULDER ARTHROSCOPY WITH ROTATOR CUFF REPAIR AND SUBACROMIAL DECOMPRESSION AND LABRAL DEBRIDMENT;  Surgeon: Jane Charleston, MD;  Location: [facility];  Service: Orthopedics;  Laterality: Right;    Family History: Family History  Problem Relation Age of Onset   Arthritis Mother    Hyperlipidemia Mother    Hypertension Father     Social History:  reports that he has never smoked. He has never used smokeless tobacco. He reports that he does not currently use alcohol. He reports that he does not use drugs.  Physical Exam: BP (!) 147/88   Pulse 80   Ht 5' 9 (1.753 m)   Wt 193 lb (87.5 kg)   BMI 28.50 kg/m    Constitutional:  Alert and oriented, No acute distress. Cardiovascular: No clubbing, cyanosis, or edema. Respiratory: Normal respiratory effort, no increased work of breathing. GI: Abdomen is soft, nontender, nondistended, no abdominal  masses GU: Testicles 20 cc and descended bilaterally without masses or lesions, vas deferens easily palpable bilaterally  Assessment & Plan:   53 year old male interested in vasectomy for permanent sterilization  We discussed the risks and benefits of vasectomy at length.  Vasectomy is intended to be a permanent form of contraception, and does not produce immediate sterility.  Following vasectomy another form of contraception is required until vas occlusion is confirmed by a post-vasectomy semen analysis obtained 2-3 months after the procedure.  Even after vas occlusion is confirmed, vasectomy is not 100% reliable in preventing pregnancy, and the failure rate is approximately 04/1998.  Repeat vasectomy is required in less than 1% of patients.  He should refrain from ejaculation for 1 week after vasectomy.  Options for fertility after vasectomy include vasectomy reversal, and sperm retrieval with in vitro fertilization or ICSI.  These options are not always successful and [place] be expensive.  Finally, there are other permanent and non-permanent alternatives to vasectomy available. There is no risk of erectile dysfunction, and the volume of semen will be similar to prior, as the majority of the ejaculate is from the prostate and seminal vesicles.   The procedure takes ~20 minutes.  We recommend patients take 5-10 mg of Valium 30 minutes prior, and he will need a driver post-procedure.  Local anesthetic is injected into the scrotal skin and a small segment of the vas deferens is removed, and the ends occluded. The complication rate is approximately 1-2%, and includes bleeding, infection, and development of chronic scrotal pain.  PLAN: Schedule vasectomy Valium sent to  pharmacy  Redell Burnet, MD 03/21/2024  [facility] [address] [place], [place] [address] [phone]

## 2024-03-21 NOTE — Patient Instructions (Signed)
 Pre-Vasectomy Instructions  STOP all aspirin or blood thinners (Aspirin, Plavix, Coumadin, Warfarin, Motrin, Ibuprofen, Advil, Aleve, Naproxen, Naprosyn) for 7 days prior to the procedure.  If you have any questions about stopping these medications please contact your primary care physician or cardiologist.  Shave all hair from the upper scrotum on the day of the procedure.  This means just under the penis onto the scrotal sac.  The area shaved should measure about 2-3 inches around.  You may lather the scrotum with soap and water, and shave with a safety razor.  After shaving the area, thoroughly wash the penis and the scrotum, then shower or bathe to remove all the loose hairs.  If needed, wash the area again just before coming in for your Vasectomy.  It is recommended to have a light meal an hour or so prior to the procedure.  Bring a scrotal support (jock strap or suspensory, or tight jockey shorts or underwear).  Wear comfortable pants or shorts.  While the actual procedure usually takes about 45 minutes, you should be prepared to stay in the office for approximately one hour.  Bring someone with you to drive you home.  If you have any questions or concerns, please feel free to call the office at [phone].  Vasectomy Vasectomy is a procedure to cut and then tie or burn the ends of the vas deferens. The vas deferens is a tube that carries sperm from the testicle to the urethra. This procedure blocks sperm from being released during sex. This ensures that sperm does not go into the vagina. A vasectomy does not affect your ability to have sex or your desire for sex. Also, it does not prevent sexually transmitted infections, or STIs. Vasectomy is a permanent and effective form of birth control. You should have a vasectomy only when you and your partner are sure you do not want children in the future. Do not get this procedure when you are stressed, such as after divorce or pregnancy  loss. Tell a health care provider about: Any allergies you have. All medicines you are taking. These include vitamins, herbs, eye drops, creams, and over-the-counter medicines. Any problems you or family members have had with anesthesia. Any bleeding problems you have. Any surgeries you have had. Any medical conditions you have. What are the risks? Your provider will talk with you about risks. These may include: Infection. Bleeding and swelling of the scrotum. The scrotum is the sac that contains the testicles. Allergies to medicines. Failure of the procedure to prevent pregnancy. There is a very small chance that the tied or burned parts of the vas deferens may reconnect. If this happens, you could still make a person pregnant. Pain in the scrotum that goes on after you heal from the procedure. What happens before the procedure? Medicines Ask your health care provider about: Changing or stopping your regular medicines. These include any diabetes medicines or blood thinners you take. Taking medicines such as aspirin and ibuprofen. These medicines can thin your blood. Do not take them unless your provider tells you to take them. Taking over-the-counter medicines, vitamins, herbs, and supplements. You may be told to take a sedative a few hours before the procedure. A sedative helps you relax. Surgery safety Ask your provider: How your surgery site will be marked. What steps will be taken to help prevent infection. These steps may include: Removing hair at the surgery site. Washing skin with a soap that kills germs. Taking antibiotics. General instructions Do  not use any products that contain nicotine or tobacco for at least 4 weeks before the procedure. These products include cigarettes, chewing tobacco, and vaping devices, such as e-cigarettes. If you need help quitting, ask your provider. If you'll be going home right after the procedure, plan to have a responsible adult: Take you  home from the hospital or clinic. You'll not be allowed to drive. Care for you for the time you are told. What happens during the procedure?  You may be given: A sedative. This helps you relax. You may also be told to take this a few hours before the procedure. Anesthesia. This keeps you from feeling pain. It will numb certain areas of your body. Your provider will feel for your vas deferens. To get to the vas deferens, your provider may: Make a very small cut, or incision, in your scrotum. Make a hole by piercing the scrotum. Your vas deferens will be pulled out of your scrotum and cut. To close it, the cut ends of the vas deferens will be tied or burned. The vas deferens will be put back into your scrotum. The cut or the hole in the scrotum will be closed with stitches. The stitches will dissolve and will not need to be removed. The procedure will be done again on the other side of your scrotum. The procedure may vary among providers and hospitals. What happens after the procedure? You will be monitored to make sure that you do not have problems. You will be asked not to ejaculate for at least 1 week after the procedure, or for as long as you are told. You will need to use another form of birth control for 2-4 months after the procedure. Do this until your provider confirms that there's no sperm in your semen. You may be given something to wear to support your scrotum. This includes a jockstrap or underwear with a pouch. If you were given a sedative during your procedure, do not drive or use machines until your provider says that it's safe. This information is not intended to replace advice given to you by your health care provider. Make sure you discuss any questions you have with your health care provider. Document Revised: 06/07/2022 Document Reviewed: 06/07/2022 Elsevier Patient Education  2024 [employer].

## 2024-03-26 ENCOUNTER — Ambulatory Visit: Payer: Self-pay | Admitting: Dermatology

## 2024-03-26 DIAGNOSIS — L409 Psoriasis, unspecified: Secondary | ICD-10-CM

## 2024-03-26 MED ORDER — SERNIVO 0.05 % EX EMUL: CUTANEOUS | 8 refills | Status: DC

## 2024-03-26 MED ORDER — ZORYVE 0.3 % EX CREA: 1.0000 | TOPICAL_CREAM | Freq: Every morning | CUTANEOUS | 8 refills | Status: AC

## 2024-03-26 NOTE — Patient Instructions (Addendum)

## 2024-03-26 NOTE — Progress Notes (Unsigned)
   Follow-Up Visit   Subjective  Reginald [name] is a 53 y.o. male who presents for the following:  6 month psoriasis follow up  Reports no active flares Reports that he is clearing with Sernivo  and Zoryve    The following portions of the chart were reviewed this encounter and updated as appropriate: medications, allergies, medical history  Review of Systems:  No other skin or systemic complaints except as noted in HPI or Assessment and Plan.  Objective  Well appearing patient in no apparent distress; mood and affect are within normal limits.   A focused examination was performed of the following areas: Buttocks b/l   Relevant exam findings are noted in the Assessment and Plan.    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 Chronic and persistent condition with duration or expected duration over one year. Condition is bothersome/symptomatic for patient. Currently flared.   patient denies joint pain   Psoriasis is a chronic non-curable, but treatable genetic/hereditary disease that [place] have other systemic features affecting other organ systems such as joints (Psoriatic Arthritis). It is associated with an increased risk of inflammatory bowel disease, heart disease, non-alcoholic fatty liver disease, and depression.  Treatments include light and laser treatments; topical medications; and systemic medications including oral and injectables.   Treatment Plan: Continue  Zoryve  cr to qd/bid 7 days /wk, when flared can increase to bid  Continue  Sernivo  3d/wk to aa buttocks, avoid f/g/a  Discussed could consider vtama in future or systemic treatment.   Topical steroids (such as triamcinolone , fluocinolone, fluocinonide, mometasone, clobetasol, halobetasol, betamethasone , hydrocortisone ) can cause thinning and lightening of the skin if they are used for too long in the same area. Your  physician has selected the right strength medicine for your problem and area affected on the body. Please use your medication only as directed by your physician to prevent side effects.   PSORIASIS   Related Medications Roflumilast  (ZORYVE ) 0.3 % CREA Apply 1 Application topically every morning. Apply to aa psoriasis on buttocks and knees qam Betamethasone  Dipropionate (SERNIVO ) 0.05 % EMUL Apply topically to buttocks three days a week as directed til clear then as needed for flares. Avoid face, groin, axilla  Return in about 1 year (around 03/26/2025) for psoriasis.  IEleanor Blush, CMA, am acting as scribe for Alm Rhyme, MD.  Documentation: I have reviewed the above documentation for accuracy and completeness, and I agree with the above.  Alm Rhyme, MD

## 2024-03-27 ENCOUNTER — Encounter: Payer: Self-pay | Admitting: Dermatology

## 2024-03-27 ENCOUNTER — Other Ambulatory Visit: Payer: Self-pay

## 2024-03-27 DIAGNOSIS — L409 Psoriasis, unspecified: Secondary | ICD-10-CM

## 2024-03-27 MED ORDER — SERNIVO 0.05 % EX EMUL: CUTANEOUS | 8 refills | Status: AC

## 2024-03-27 NOTE — Progress Notes (Signed)
 Change of RX for pharmacy. aw

## 2024-05-08 ENCOUNTER — Ambulatory Visit (INDEPENDENT_AMBULATORY_CARE_PROVIDER_SITE_OTHER): Payer: Self-pay | Admitting: Urology

## 2024-05-08 VITALS — BP 136/83 | HR 89 | Ht 69.0 in | Wt 193.0 lb

## 2024-05-08 DIAGNOSIS — Z302 Encounter for sterilization: Secondary | ICD-10-CM

## 2024-05-08 NOTE — Patient Instructions (Signed)

## 2024-05-08 NOTE — Progress Notes (Signed)
 VASECTOMY PROCEDURE NOTE:  The patient was taken to the minor procedure room and placed in the supine position. His genitals were prepped and draped in the usual sterile fashion. The right vas deferens was brought up to the skin of the right upper scrotum. The skin overlying it was anesthetized with 1% lidocaine  without epinephrine , anesthetic was also injected alongside the vas deferens in the direction of the inguinal canal. The no scalpel vasectomy instrument was used to make a small perforation in the scrotal skin. The vasectomy clamp was used to grasp the vas deferens. It was carefully dissected free from surrounding structures. A 1cm segment of the vas was removed, and the cut ends of the mucosa were cauterized. No significant bleeding was noted. The vas deferens was returned to the scrotum. The skin incision was closed with a simple interrupted stitch of 4-0 chromic.  Attention was then turned to the left side. The left vasectomy was performed in the same exact fashion. Sterile dressings were placed over each incision. The patient tolerated the procedure well.  IMPRESSION/DIAGNOSIS: The patient is a 54 year old gentleman who underwent a vasectomy today. Post-procedure instructions were reviewed. I stressed the importance of continuing to use birth control until he provides a semen specimen more than 2 months from now that demonstrates azoospermia.  We discussed return precautions including fever over 101, significant bleeding or hematoma, or uncontrolled pain. I also stressed the importance of avoiding strenuous activity for one week, no sexual activity or ejaculations for 5 days, intermittent icing over the next 48 hours, and scrotal support.   PLAN: The patient will be advised of his semen analysis results when available.  Redell Burnet, MD 05/08/2024

## 2024-07-08 ENCOUNTER — Encounter: Admitting: Family Medicine

## 2024-08-07 ENCOUNTER — Other Ambulatory Visit: Payer: Self-pay

## 2025-03-26 ENCOUNTER — Ambulatory Visit: Admitting: Dermatology
